# Patient Record
Sex: Female | Born: 1970 | Race: Black or African American | Hispanic: No | Marital: Single | Smoking: Current every day smoker
Health system: Southern US, Community
[De-identification: ages and names within clinical notes are randomized; demographics above are authoritative.]

## PROBLEM LIST (undated history)

## (undated) DIAGNOSIS — G5600 Carpal tunnel syndrome, unspecified upper limb: Secondary | ICD-10-CM

## (undated) DIAGNOSIS — F419 Anxiety disorder, unspecified: Secondary | ICD-10-CM

## (undated) HISTORY — PX: NECK SURGERY: SHX720

## (undated) HISTORY — PX: INCISION AND DRAINAGE BREAST ABSCESS: SUR672

---

## 2001-10-04 ENCOUNTER — Other Ambulatory Visit: Admission: RE | Admit: 2001-10-04 | Discharge: 2001-10-04 | Payer: Self-pay | Admitting: Obstetrics and Gynecology

## 2004-03-12 ENCOUNTER — Other Ambulatory Visit: Admission: RE | Admit: 2004-03-12 | Discharge: 2004-03-12 | Payer: Self-pay | Admitting: Obstetrics and Gynecology

## 2005-06-02 ENCOUNTER — Other Ambulatory Visit: Admission: RE | Admit: 2005-06-02 | Discharge: 2005-06-02 | Payer: Self-pay | Admitting: Obstetrics and Gynecology

## 2011-07-09 ENCOUNTER — Other Ambulatory Visit: Payer: Self-pay | Admitting: Orthopedic Surgery

## 2011-07-09 DIAGNOSIS — M5410 Radiculopathy, site unspecified: Secondary | ICD-10-CM

## 2011-07-11 ENCOUNTER — Ambulatory Visit
Admission: RE | Admit: 2011-07-11 | Discharge: 2011-07-11 | Disposition: A | Discharge status: Home / Self Care | Payer: PRIVATE HEALTH INSURANCE | Source: Ambulatory Visit | Attending: Orthopedic Surgery | Admitting: Orthopedic Surgery

## 2011-07-11 DIAGNOSIS — M5410 Radiculopathy, site unspecified: Secondary | ICD-10-CM

## 2011-12-08 ENCOUNTER — Other Ambulatory Visit: Payer: Self-pay | Admitting: Physician Assistant

## 2011-12-08 ENCOUNTER — Other Ambulatory Visit (HOSPITAL_COMMUNITY)
Admission: RE | Admit: 2011-12-08 | Discharge: 2011-12-08 | Disposition: A | Discharge status: Home / Self Care | Payer: BC Managed Care – PPO | Source: Ambulatory Visit | Attending: Family Medicine | Admitting: Family Medicine

## 2011-12-08 DIAGNOSIS — Z124 Encounter for screening for malignant neoplasm of cervix: Secondary | ICD-10-CM | POA: Insufficient documentation

## 2012-06-25 ENCOUNTER — Encounter (HOSPITAL_COMMUNITY): Payer: Self-pay | Admitting: Emergency Medicine

## 2012-06-25 ENCOUNTER — Emergency Department (INDEPENDENT_AMBULATORY_CARE_PROVIDER_SITE_OTHER)
Admission: EM | Admit: 2012-06-25 | Discharge: 2012-06-25 | Disposition: A | Discharge status: Home / Self Care | Payer: PRIVATE HEALTH INSURANCE | Source: Home / Self Care | Attending: Emergency Medicine | Admitting: Emergency Medicine

## 2012-06-25 DIAGNOSIS — R21 Rash and other nonspecific skin eruption: Secondary | ICD-10-CM

## 2012-06-25 DIAGNOSIS — M2550 Pain in unspecified joint: Secondary | ICD-10-CM

## 2012-06-25 DIAGNOSIS — L304 Erythema intertrigo: Secondary | ICD-10-CM

## 2012-06-25 DIAGNOSIS — L538 Other specified erythematous conditions: Secondary | ICD-10-CM

## 2012-06-25 MED ORDER — KETOCONAZOLE 2 % EX CREA: TOPICAL_CREAM | Freq: Every day | CUTANEOUS | Status: DC

## 2012-06-25 NOTE — ED Notes (Signed)
Pt c/o rash on face that started out above right eye x 41month now rash has  Spread over face within the past two weeks and is having severe itching.  Pt has used only soap and water for cleansing.   Pt denies any changes in diet and or products.  Pt also has pain in right foot that has been present for 1 wk. Pt states that there is a bump btwn 2 and 3rd toe and the pain is a burning sensation,.  Most pain is felt with walking. Pt has used soaks and sprays with mild relief.

## 2012-06-25 NOTE — ED Provider Notes (Signed)
History     CSN: [id number]  Arrival date & time 06/25/12  7829   First MD Initiated Contact with Patient 06/25/12 1844      Chief Complaint  Patient presents with  . Rash    rash above right eye x 1 month pt noticed rash started to spread two weeks ago. severe itching  . Foot Pain    bump in between second and third toe on the right foot. with burining sensation    (Consider location/radiation/quality/duration/timing/severity/associated sxs/prior treatment) HPI Comments: Patient presents urgent care tonight complaining of 2 different issues. #1 she's been developing this rash on her face that started on her right upper eyebrow but had extended to both of her maxillary regions some more spread out on her cheeks. She said that it is itchy and she does not have any other rashes over in her neck arms or torso. She denies having used any new products. Has only used soap and water for cleansing. She also describes she's been having elbow and wrist pains for several weeks, sometimes even at rest. Patient describes also that she has had a area of tenderness and discharge for about a week in between the second and third toe of her right foot as burn at times. She has used some over-the-counter fungal cream that has not worked  Patient is a 41 y.o. female presenting with rash and lower extremity pain. The history is provided by the patient.  Rash  This is a new problem. The current episode started more than 1 week ago. The problem has not changed since onset.The problem is associated with nothing. There has been no fever. The patient is experiencing no pain. Associated symptoms include weeping. Pertinent negatives include no blisters, no itching and no pain.  Foot Pain    History reviewed. No pertinent past medical history.  Past Surgical History  Procedure Date  . Neck surgery     History reviewed. No pertinent family history.  History  Substance Use Topics  . Smoking status: Current  Every Day Smoker -- 1.0 packs/day    Types: Cigarettes  . Smokeless tobacco: Not on file  . Alcohol Use: Yes     occasional    OB History    Grav Para Term Preterm Abortions TAB SAB Ect Mult Living                  Review of Systems  Constitutional: Negative for fever, chills, activity change and appetite change.  Respiratory: Cough: yelling.   Musculoskeletal: Positive for arthralgias. Negative for back pain.  Skin: Positive for rash. Negative for itching.    Allergies  Review of patient's allergies indicates no known allergies.  Home Medications   Current Outpatient Rx  Name Route Sig Dispense Refill  . KETOCONAZOLE 2 % EX CREA Topical Apply topically daily. Apply bid affected toes x 3 weeks 15 g 0    BP 144/88  Pulse 78  Temp 98.7 F (37.1 C)  Resp 16  SpO2 98%  LMP 06/04/2012  Physical Exam  Nursing note and vitals reviewed. Constitutional: Vital signs are normal. She appears well-developed and well-nourished.  Non-toxic appearance. She does not have a sickly appearance. She does not appear ill. No distress.  HENT:  Head:    Cardiovascular: Normal rate.   Skin: Rash noted. There is erythema.    ED Course  Procedures (including critical care time)  Labs Reviewed - No data to display No results found.   1. Facial rash  2. Arthralgia   3. Toe web intertrigo       MDM  Patient presented with a facial malar rash- she was referred to see a dermatologist as this could be secondary to connective tissue disorder. She does report some arthralgias. Patient also had a into her digital fungal infection. He was treated with Nizoral.    Jimmie Molly, MD 06/25/12 2027

## 2012-12-02 ENCOUNTER — Emergency Department (HOSPITAL_COMMUNITY)
Admission: EM | Admit: 2012-12-02 | Discharge: 2012-12-02 | Disposition: A | Discharge status: Home / Self Care | Payer: BC Managed Care – PPO | Source: Home / Self Care

## 2012-12-02 ENCOUNTER — Encounter (HOSPITAL_COMMUNITY): Payer: Self-pay | Admitting: *Deleted

## 2012-12-02 DIAGNOSIS — N61 Mastitis without abscess: Secondary | ICD-10-CM

## 2012-12-02 MED ORDER — CEFTRIAXONE SODIUM 1 G IJ SOLR
1.0000 g | Freq: Once | INTRAMUSCULAR | Status: AC
  Administered 2012-12-02: 1 g via INTRAMUSCULAR

## 2012-12-02 MED ORDER — FLUCONAZOLE 150 MG PO TABS: ORAL_TABLET | ORAL | Status: DC

## 2012-12-02 MED ORDER — CEPHALEXIN 500 MG PO CAPS: 500.0000 mg | ORAL_CAPSULE | Freq: Four times a day (QID) | ORAL | Status: DC

## 2012-12-02 MED ORDER — CEFTRIAXONE SODIUM 1 G IJ SOLR
INTRAMUSCULAR | Status: AC
  Filled 2012-12-02: qty 10

## 2012-12-02 MED ORDER — HYDROCODONE-ACETAMINOPHEN 5-325 MG PO TABS: 1.0000 | ORAL_TABLET | ORAL | Status: DC | PRN

## 2012-12-02 MED ORDER — LIDOCAINE HCL (PF) 1 % IJ SOLN
INTRAMUSCULAR | Status: AC
  Filled 2012-12-02: qty 5

## 2012-12-02 NOTE — ED Notes (Signed)
Bump on R breast onset Mon.  Noted redness around her nipple this AM.

## 2012-12-02 NOTE — ED Provider Notes (Signed)
Medical screening examination/treatment/procedure(s) were performed by resident physician or non-physician practitioner and as supervising physician I was immediately available for consultation/collaboration.   Ayven Pheasant DOUGLAS MD.   Bryam Taborda D Cathlyn Tersigni, MD 12/02/12 2000 

## 2012-12-02 NOTE — ED Provider Notes (Signed)
History     CSN: [id number]  Arrival date & time 12/02/12  1547   First MD Initiated Contact with Patient 12/02/12 1734      No chief complaint on file.   (Consider location/radiation/quality/duration/timing/severity/associated sxs/prior treatment) HPI Comments: 42 year old female developed itching and mild swelling in the right nipple approximately 3 days ago. The erythema and discomfort has progressed in this time today she is complaining of increased pain. Denies fever, chills or other symptoms. The left breast is unaffected. Marcella, MA present during the exam.   No past medical history on file.  Past Surgical History  Procedure Laterality Date  . Neck surgery      No family history on file.  History  Substance Use Topics  . Smoking status: Current Every Day Smoker -- 1.00 packs/day    Types: Cigarettes  . Smokeless tobacco: Not on file  . Alcohol Use: Yes     Comment: occasional    OB History   Grav Para Term Preterm Abortions TAB SAB Ect Mult Living                  Review of Systems  Constitutional: Negative.   Respiratory: Negative.   Gastrointestinal: Negative.   Skin:       Per history of present illness  Hematological: Negative.     Allergies  Review of patient's allergies indicates no known allergies.  Home Medications   Current Outpatient Rx  Name  Route  Sig  Dispense  Refill  . cephALEXin (KEFLEX) 500 MG capsule   Oral   Take 1 capsule (500 mg total) by mouth 4 (four) times daily. X 10 days   40 capsule   0   . fluconazole (DIFLUCAN) 150 MG tablet      1 tab po x 1. May repeat in 72 hours if no improvement   2 tablet   0   . HYDROcodone-acetaminophen (NORCO/VICODIN) 5-325 MG per tablet   Oral   Take 1 tablet by mouth every 4 (four) hours as needed for pain.   15 tablet   0   . ketoconazole (NIZORAL) 2 % cream   Topical   Apply topically daily. Apply bid affected toes x 3 weeks   15 g   0     BP 144/73  Pulse 84   Temp(Src) 98 F (36.7 C) (Oral)  Resp 16  SpO2 100%  Physical Exam  Nursing note and vitals reviewed. Constitutional: She is oriented to person, place, and time. She appears well-developed and well-nourished. No distress.  Eyes: EOM are normal.  Neck: Normal range of motion. Neck supple.  Pulmonary/Chest: Effort normal.  Musculoskeletal: She exhibits no edema.  Lymphadenopathy:    She has no cervical adenopathy.    She has no axillary adenopathy.       Right: No supraclavicular adenopathy present.       Left: No supraclavicular adenopathy present.  Neurological: She is alert and oriented to person, place, and time.  Skin:  Right breast with minor swelling of the nipple and very old. Approximately 4 cm diameter area of induration beneath the area all and erythema extends  circumferentially covering approximately 30-40% of the anterior breast.    ED Course  Procedures (including critical care time)  Labs Reviewed - No data to display No results found.   1. Mastitis, right, acute   2. Cellulitis of female breast       MDM  Rocephin 1 gm IM now. Keflex 500 qid  x 10 days Norco 5 q 4 h prn pain Warm compresses several times a day. Return here in 2 days for a recheck.        Hayden Rasmussen, NP 12/02/12 [phone]

## 2012-12-05 ENCOUNTER — Encounter (INDEPENDENT_AMBULATORY_CARE_PROVIDER_SITE_OTHER): Payer: Self-pay | Admitting: Surgery

## 2012-12-05 ENCOUNTER — Encounter (INDEPENDENT_AMBULATORY_CARE_PROVIDER_SITE_OTHER): Payer: Self-pay

## 2012-12-05 ENCOUNTER — Ambulatory Visit (INDEPENDENT_AMBULATORY_CARE_PROVIDER_SITE_OTHER): Payer: BC Managed Care – PPO | Admitting: Surgery

## 2012-12-05 VITALS — BP 124/82 | HR 63 | Temp 98.0°F | Resp 18 | Ht >= 80 in | Wt 151.0 lb

## 2012-12-05 DIAGNOSIS — N61 Mastitis without abscess: Secondary | ICD-10-CM

## 2012-12-05 DIAGNOSIS — N611 Abscess of the breast and nipple: Secondary | ICD-10-CM | POA: Insufficient documentation

## 2012-12-05 DIAGNOSIS — Z720 Tobacco use: Secondary | ICD-10-CM | POA: Insufficient documentation

## 2012-12-05 DIAGNOSIS — F17200 Nicotine dependence, unspecified, uncomplicated: Secondary | ICD-10-CM

## 2012-12-05 MED ORDER — IBUPROFEN 800 MG PO TABS: 800.0000 mg | ORAL_TABLET | Freq: Four times a day (QID) | ORAL | Status: DC | PRN

## 2012-12-05 MED ORDER — HYDROCODONE-ACETAMINOPHEN 5-325 MG PO TABS: 1.0000 | ORAL_TABLET | Freq: Four times a day (QID) | ORAL | Status: DC | PRN

## 2012-12-05 NOTE — Addendum Note (Signed)
Addended by: Ethlyn Gallery on: 12/05/2012 02:23 PM   Modules accepted: Orders

## 2012-12-05 NOTE — Patient Instructions (Addendum)
Abscess An abscess is an infected area that contains a collection of pus and debris.It can occur in almost any part of the body. An abscess is also known as a furuncle or boil. CAUSES  An abscess occurs when tissue gets infected. This can occur from blockage of oil or sweat glands, infection of hair follicles, or a minor injury to the skin. As the body tries to fight the infection, pus collects in the area and creates pressure under the skin. This pressure causes pain. People with weakened immune systems have difficulty fighting infections and get certain abscesses more often.  SYMPTOMS Usually an abscess develops on the skin and becomes a painful mass that is red, warm, and tender. If the abscess forms under the skin, you may feel a moveable soft area under the skin. Some abscesses break open (rupture) on their own, but most will continue to get worse without care. The infection can spread deeper into the body and eventually into the bloodstream, causing you to feel ill.  DIAGNOSIS  Your caregiver will take your medical history and perform a physical exam. A sample of fluid may also be taken from the abscess to determine what is causing your infection. TREATMENT  Your caregiver may prescribe antibiotic medicines to fight the infection. However, taking antibiotics alone usually does not cure an abscess. Your caregiver may need to make a small cut (incision) in the abscess to drain the pus. In some cases, gauze is packed into the abscess to reduce pain and to continue draining the area. HOME CARE INSTRUCTIONS   Only take over-the-counter or prescription medicines for pain, discomfort, or fever as directed by your caregiver.  If you were prescribed antibiotics, take them as directed. Finish them even if you start to feel better.  If gauze is used, follow your caregiver's directions for changing the gauze.  To avoid spreading the infection:  Keep your draining abscess covered with a  bandage.  Wash your hands well.  Do not share personal care items, towels, or whirlpools with others.  Avoid skin contact with others.  Keep your skin and clothes clean around the abscess.  Keep all follow-up appointments as directed by your caregiver. SEEK MEDICAL CARE IF:   You have increased pain, swelling, redness, fluid drainage, or bleeding.  You have muscle aches, chills, or a general ill feeling.  You have a fever. MAKE SURE YOU:   Understand these instructions.  Will watch your condition.  Will get help right away if you are not doing well or get worse. Document Released: 05/27/2005 Document Revised: 02/16/2012 Document Reviewed: 10/30/2011 [employer] Patient Information 2013 [place], [place].   WOUND CARE  It is important that the wound be kept open.   -Keeping the skin edges apart will allow the wound to gradually heal from the base upwards.   - If the skin edges of the wound close too early, a new fluid pocket can form and infection can occur. -This is the reason to pack deeper wounds with gauze or ribbon -This is why drained wounds cannot be sewed closed right away  A healthy wound should form a lining of bright red "beefy" granulating tissue that will help shrink the wound and help the edges grow new skin into it.   -A little mucus / yellow discharge is normal (the body's natural way to try and form a scab) and should be gently washed off with soap and water with daily dressing changes.  -Green or foul smelling drainage implies bacterial  colonization and can slow wound healing - a short course of antibiotic ointment (3-5 days) can help it clear up.  Call the doctor if it does not improve or worsens  -Avoid use of antibiotic ointments for more than a week as they can slow wound healing over time.    -Sometimes other wound care products will be used to reduce need for dressing changes and/or help clean up dirty wounds -Sometimes the surgeon needs to debride the  wound in the office to remove dead or infected tissue out of the wound so it can heal more quickly and safely.    Change the dressing at least once a day -Wash the wound with mild soap and water gently every day.  It is good to shower or bathe the wound to help it clean out. -Use clean 4x4 gauze for medium/large wounds or ribbon plain NU-gauze for smaller wounds (it does not need to be sterile, just clean) -Keep the raw wound moist with a little saline or KY (saline) gel on the gauze.  -A dry wound will take longer to heal.  -Keep the skin dry around the wound to prevent breakdown and irritation. -Pack the wound down to the base -The goal is to keep the skin apart, not overpack the wound -Use a Q-tip or blunt-tipped kabob stick toothpick to push the gauze down to the base in narrow or deep wounds   -Cover with a clean gauze and tape -paper or Medipore tape tend to be gentle on the skin -rotate the orientation of the tape to avoid repeated stress/trauma on the skin -using an ACE or Coban wrap on wounds on arms or legs can be used instead.  Complete all antibiotics through the entire prescription to help the infection heal and prevent new places of infection   Returning the see the surgeon is helpful to follow the healing process and help the wound close as fast as possible.    Mastitis  Mastitis is a bacterial infection of the breast tissue. CAUSES  Bacteria causes infection by entering the breast tissue through cuts or openings in the skin. Typically, this occurs with breastfeeding due to cracked or irritated skin. It can be associated with plugged ducts. Nipple piercing can also lead to mastitis. SYMPTOMS  In mastitis, an area of the breast becomes swollen, red, tender, and painful. You may notice you have a fever and swelling of the glands under your arm on that side. If the infection is allowed to progress, a collection of pus (abscess) may develop. DIAGNOSIS  Your caregiver can  diagnose mastitis based on your symptoms and upon examination. The diagnosis can be confirmed if pus can be expressed from the breast. This pus can be examined in the lab to determine which bacteria are present. If an abscess has developed, the fluid in the abscess can be removed with a needle. This is used to confirm the diagnosis and determine the bacteria present. In most cases, pus will not be present. Blood tests can be done to determine if your body is fighting a bacterial infection. Sometimes, a mammogram or ultrasound will be recommended to exclude other breast diseases including cancer. Other rare forms of mastitis:  Tuberculosis mastitis is rare. The TB germ can affect the breast if it is present in some other part of the body. The breast may be slightly tender with a mass, but not tender or painful.  Syphilis of the nipple usually has an ulcer that is not tender.  Actinomycosis  is a very rare bacterial infection of the breast that presents as a mass in the breast that is not tender or painful.  Phlebitis (inflammation of blood vessels) of the breast is an inflammation of the veins in the breast. It may be caused by tight fitting bras, surgery, or trauma to the breast.  Inflammatory carcinoma of the breast looks like mastitis because the breasts are red, swollen, or tender, but it is a rare form of breast cancer. TREATMENT  Antibiotic medication is used to treat the bacterial infection. Your caregiver will determine which bacteria are most likely to be causing the infection and select an antibiotic. This is sometimes changed based on the results of cultures, or if there is no response to the antibiotic selected. Antibiotics are usually given by mouth. If you are breastfeeding, it is important to continue to empty the breast. Your caregiver can tell you whether or not this milk is safe for your infant, or needs to be thrown away. Pain can usually be treated with medication. HOME CARE  INSTRUCTIONS   Take your antibiotics as directed. Finish them even if you start to feel better.  Only take over-the-counter or prescription medication for pain, discomfort, or fever as directed by your caregiver.  If breastfeeding, keep your nipples clean and dry. Your caregiver may tell you to stop nursing until he or she feels it is safe for your baby. Use a breast pump as instructed if forced to stop nursing.  Do not wear a tight bra. Wear a good support bra.  Empty the first breast completely before going to the other breast. If your baby is not emptying your breasts completely for some reason, use a breast pump to empty your breasts.  If you go back to work, pump your breasts while at work to stay in time with your nursing schedule.  Increase your fluid intake especially if you have a fever.  Avoid having your breasts get overly filled with milk (engorged). SEEK MEDICAL CARE IF:   You develop pus-like (purulent) discharge from the breast.  Your symptoms get worse.  You do not seem to be responding to your treatment within 2 days. SEEK IMMEDIATE MEDICAL CARE IF:   You have a fever.  Your pain and swelling is getting worse.  You develop pain that is not controlled with medicine.  You develop a red line extending from the breast toward your armpit. Document Released: 08/17/2005 Document Revised: 11/09/2011 Document Reviewed: 04/06/2008 [facility] Patient Information 2013 [place], [place]. Managing Pain  Pain after surgery or related to activity is often due to strain/injury to muscle, tendon, nerves and/or incisions.  This pain is usually short-term and will improve in a few months.   Many people find it helpful to do the following things TOGETHER to help speed the process of healing and to get back to regular activity more quickly:  1. Avoid heavy physical activity a.  no lifting greater than 20 pounds b. Do not "push through" the pain.  Listen to your body and avoid  positions and maneuvers than reproduce the pain c. Walking is okay as tolerated, but go slowly and stop when getting sore.  d. Remember: If it hurts to do it, then don't do it! 2. Take Anti-inflammatory medication  a. Take with food/snack around the clock for 1-2 weeks i. This helps the muscle and nerve tissues become less irritable and calm down faster b. Choose ONE of the following over-the-counter medications: i. Naproxen 220mg  tabs (ex. Aleve)  1-2 pills twice a day  ii. Ibuprofen 200mg  tabs (ex. Advil, Motrin) 3-4 pills with every meal and just before bedtime iii. Acetaminophen 500mg  tabs (Tylenol) 1-2 pills with every meal and just before bedtime 3. Use a Heating pad or Ice/Cold Pack a. 4-6 times a day b. May use warm bath/hottub  or showers 4. Try Gentle Massage and/or Stretching  a. at the area of pain many times a day b. stop if you feel pain - do not overdo it  Try these steps together to help you body heal faster and avoid making things get worse.  Doing just one of these things may not be enough.    If you are not getting better after two weeks or are noticing you are getting worse, contact our office for further advice; we may need to re-evaluate you & see what other things we can do to help.    Smoking Cessation Quitting smoking is important to your health and has many advantages. However, it is not always easy to quit since nicotine is a very addictive drug. Often times, people try 3 times or more before being able to quit. This document explains the best ways for you to prepare to quit smoking. Quitting takes hard work and a lot of effort, but you can do it. ADVANTAGES OF QUITTING SMOKING  You will live longer, feel better, and live better.  Your body will feel the impact of quitting smoking almost immediately.  Within 20 minutes, blood pressure decreases. Your pulse returns to its normal level.  After 8 hours, carbon monoxide levels in the blood return to normal. Your  oxygen level increases.  After 24 hours, the chance of having a heart attack starts to decrease. Your breath, hair, and body stop smelling like smoke.  After 48 hours, damaged nerve endings begin to recover. Your sense of taste and smell improve.  After 72 hours, the body is virtually free of nicotine. Your bronchial tubes relax and breathing becomes easier.  After 2 to 12 weeks, lungs can hold more air. Exercise becomes easier and circulation improves.  The risk of having a heart attack, stroke, cancer, or lung disease is greatly reduced.  After 1 year, the risk of coronary heart disease is cut in half.  After 5 years, the risk of stroke falls to the same as a nonsmoker.  After 10 years, the risk of lung cancer is cut in half and the risk of other cancers decreases significantly.  After 15 years, the risk of coronary heart disease drops, usually to the level of a nonsmoker.  If you are pregnant, quitting smoking will improve your chances of having a healthy baby.  The people you live with, especially any children, will be healthier.  You will have extra money to spend on things other than cigarettes. QUESTIONS TO THINK ABOUT BEFORE ATTEMPTING TO QUIT You may want to talk about your answers with your caregiver.  Why do you want to quit?  If you tried to quit in the past, what helped and what did not?  What will be the most difficult situations for you after you quit? How will you plan to handle them?  Who can help you through the tough times? Your family? Friends? A caregiver?  What pleasures do you get from smoking? What ways can you still get pleasure if you quit? Here are some questions to ask your caregiver:  How can you help me to be successful at quitting?  What medicine do  you think would be best for me and how should I take it?  What should I do if I need more help?  What is smoking withdrawal like? How can I get information on withdrawal? GET READY  Set a quit  date.  Change your environment by getting rid of all cigarettes, ashtrays, matches, and lighters in your home, car, or work. Do not let people smoke in your home.  Review your past attempts to quit. Think about what worked and what did not. GET SUPPORT AND ENCOURAGEMENT You have a better chance of being successful if you have help. You can get support in many ways.  Tell your family, friends, and co-workers that you are going to quit and need their support. Ask them not to smoke around you.  Get individual, group, or telephone counseling and support. Programs are available at [employer] and health centers. Call your local health department for information about programs in your area.  Spiritual beliefs and practices may help some smokers quit.  Download a "quit meter" on your computer to keep track of quit statistics, such as how long you have gone without smoking, cigarettes not smoked, and money saved.  Get a self-help book about quitting smoking and staying off of tobacco. LEARN NEW SKILLS AND BEHAVIORS  Distract yourself from urges to smoke. Talk to someone, go for a walk, or occupy your time with a task.  Change your normal routine. Take a different route to work. Drink tea instead of coffee. Eat breakfast in a different place.  Reduce your stress. Take a hot bath, exercise, or read a book.  Plan something enjoyable to do every day. Reward yourself for not smoking.  Explore interactive web-based programs that specialize in helping you quit. GET MEDICINE AND USE IT CORRECTLY Medicines can help you stop smoking and decrease the urge to smoke. Combining medicine with the above behavioral methods and support can greatly increase your chances of successfully quitting smoking.  Nicotine replacement therapy helps deliver nicotine to your body without the negative effects and risks of smoking. Nicotine replacement therapy includes nicotine gum, lozenges, inhalers, nasal sprays, and  skin patches. Some may be available over-the-counter and others require a prescription.  Antidepressant medicine helps people abstain from smoking, but how this works is unknown. This medicine is available by prescription.  Nicotinic receptor partial agonist medicine simulates the effect of nicotine in your brain. This medicine is available by prescription. Ask your caregiver for advice about which medicines to use and how to use them based on your health history. Your caregiver will tell you what side effects to look out for if you choose to be on a medicine or therapy. Carefully read the information on the package. Do not use any other product containing nicotine while using a nicotine replacement product.  RELAPSE OR DIFFICULT SITUATIONS Most relapses occur within the first 3 months after quitting. Do not be discouraged if you start smoking again. Remember, most people try several times before finally quitting. You may have symptoms of withdrawal because your body is used to nicotine. You may crave cigarettes, be irritable, feel very hungry, cough often, get headaches, or have difficulty concentrating. The withdrawal symptoms are only temporary. They are strongest when you first quit, but they will go away within 10 14 days. To reduce the chances of relapse, try to:  Avoid drinking alcohol. Drinking lowers your chances of successfully quitting.  Reduce the amount of caffeine you consume. Once you quit smoking, the amount  of caffeine in your body increases and can give you symptoms, such as a rapid heartbeat, sweating, and anxiety.  Avoid smokers because they can make you want to smoke.  Do not let weight gain distract you. Many smokers will gain weight when they quit, usually less than 10 pounds. Eat a healthy diet and stay active. You can always lose the weight gained after you quit.  Find ways to improve your mood other than smoking. FOR MORE INFORMATION  www.smokefree.gov  Document  Released: 08/11/2001 Document Revised: 02/16/2012 Document Reviewed: 11/26/2011 [facility] Patient Information 2013 [place], [place].

## 2012-12-05 NOTE — Progress Notes (Signed)
Subjective:     Patient ID: Yesenia Miller, female   DOB: 11-11-1970, 42 y.o.   MRN: [id number]  HPI  Setareh Rom  Jan 07, 1971 [id number]  Patient Care Team: Milus Height, PA-C as PCP - General (Nurse Practitioner)  This patient is a 42 y.o.female who presents today for surgical evaluation at the request of Mrs. Sherlyn Lick.   Reason for visit: Right breast pain and swelling.  Probable abscess.  Young smoking female.  No history of skin or breast problems.  Never breast-fed.  No falls or trauma.  Noticed itching at her right nipple.  Scratched.  Got worsening swelling and pain.  Went to urgent care 3 days ago.  Started on oral antibiotics (Keflex w fluconazole).  Seemed to worsen a little bit.  Certainly not improved.  Tender.  Concerned.  Sent to me for evaluation.  Patient Active Problem List  Diagnosis  . Abscess of breast, right  . Tobacco abuse    No past medical history on file.  Past Surgical History  Procedure Laterality Date  . Neck surgery      History   Social History  . Marital Status: Single    Spouse Name: N/A    Number of Children: N/A  . Years of Education: N/A   Occupational History  . Not on file.   Social History Main Topics  . Smoking status: Current Every Day Smoker -- 1.00 packs/day    Types: Cigarettes  . Smokeless tobacco: Not on file  . Alcohol Use: Yes     Comment: occasional  . Drug Use: No  . Sexually Active: Yes    Birth Control/ Protection: None   Other Topics Concern  . Not on file   Social History Narrative  . No narrative on file    Family History  Problem Relation Age of Onset  . Diabetes Mother   . Thyroid disease Mother     Current Outpatient Prescriptions  Medication Sig Dispense Refill  . cephALEXin (KEFLEX) 500 MG capsule Take 1 capsule (500 mg total) by mouth 4 (four) times daily. X 10 days  40 capsule  0  . fluconazole (DIFLUCAN) 150 MG tablet 1 tab po x 1. May repeat in 72 hours if no improvement  2  tablet  0  . ibuprofen (ADVIL,MOTRIN) 800 MG tablet Take 800 mg by mouth every 8 (eight) hours as needed for pain.      Marland Kitchen HYDROcodone-acetaminophen (NORCO/VICODIN) 5-325 MG per tablet Take 1 tablet by mouth every 4 (four) hours as needed for pain.  15 tablet  0  . ketoconazole (NIZORAL) 2 % cream Apply topically daily. Apply bid affected toes x 3 weeks  15 g  0   No current facility-administered medications for this visit.     No Known Allergies  BP 124/82  Pulse 63  Temp(Src) 98 F (36.7 C)  Resp 18  Ht 7\' 8"  (2.337 m)  Wt 151 lb (68.493 kg)  BMI 12.54 kg/m2  LMP 11/28/2012  No results found.   Review of Systems  Constitutional: Negative for fever, chills, diaphoresis, appetite change and fatigue.  HENT: Negative for ear pain, sore throat, trouble swallowing, neck pain and ear discharge.   Eyes: Negative for photophobia, discharge and visual disturbance.  Respiratory: Negative for cough, choking, chest tightness and shortness of breath.   Cardiovascular: Negative for chest pain and palpitations.  Gastrointestinal: Negative for nausea, vomiting, abdominal pain, diarrhea, constipation, anal bleeding and rectal pain.  Genitourinary: Negative for dysuria, frequency and  difficulty urinating.  Musculoskeletal: Negative for myalgias and gait problem.  Skin: Negative for color change, pallor and rash.  Neurological: Negative for dizziness, speech difficulty, weakness and numbness.  Hematological: Negative for adenopathy.  Psychiatric/Behavioral: Negative for confusion and agitation. The patient is not nervous/anxious.        Objective:   Physical Exam  Constitutional: She is oriented to person, place, and time. She appears well-developed and well-nourished. No distress.  HENT:  Head: Normocephalic.  Mouth/Throat: Oropharynx is clear and moist. No oropharyngeal exudate.  Eyes: Conjunctivae and EOM are normal. Pupils are equal, round, and reactive to light. No scleral icterus.    Neck: Normal range of motion. Neck supple. No tracheal deviation present.  Cardiovascular: Normal rate, regular rhythm and intact distal pulses.   Pulmonary/Chest: Effort normal and breath sounds normal. No respiratory distress. She exhibits no mass and no tenderness. Right breast exhibits inverted nipple, mass, skin change and tenderness. Right breast exhibits no nipple discharge. Left breast exhibits no inverted nipple, no mass, no nipple discharge, no skin change and no tenderness. Breasts are asymmetrical.    Abdominal: Soft. She exhibits no distension and no mass. There is no tenderness. Hernia confirmed negative in the right inguinal area and confirmed negative in the left inguinal area.  Genitourinary: No vaginal discharge found.  Musculoskeletal: Normal range of motion. She exhibits no tenderness.  Lymphadenopathy:       Head (right side): No submental and no submandibular adenopathy present.       Head (left side): No submental and no submandibular adenopathy present.    She has no cervical adenopathy.    She has no axillary adenopathy.       Right: No inguinal adenopathy present.       Left: No inguinal adenopathy present.  Neurological: She is alert and oriented to person, place, and time. No cranial nerve deficit. She exhibits normal muscle tone. Coordination normal.  Skin: Skin is warm and dry. No rash noted. She is not diaphoretic. No erythema.  Psychiatric: She has a normal mood and affect. Her behavior is normal. Judgment and thought content normal.       Assessment:     Right breast abscess and upper outer nipple with cellulitis.     Plan:     Incision and drainage:  The pathophysiology of subcutaneous abscess and differential diagnosis was discussed.  Natural history progression was discussed.  The patient's symptoms are not adequately controlled.  Non-operative treatment has not healed the abscess.  Therefore, I recommended incision & drainage of the abscess to allow  the infection to resolve and heal.  Technique, risks, benefits, alternatives discussed.  The patient expressed understanding & wished to proceed.  I placed a field block with local anaesthetic.  I incised the skin over the abscess to release the infection.   I packed the wound with 1/4" ribbon NU-Gauze. 2x2cm superficial abscess w 4cm deeper tracking  The patient tolerated the procedure.  Continue Keflex for now.  Switched based on what cultures show.  We will have the patient return to clinic for close follow up to make sure the infection heals.  She is out of pain medication.  I renewed her Norco and ibuprofen  We talked to the patient about the dangers of smoking.  We stressed that tobacco use dramatically increases the risk of peri-operative complications such as infection, tissue necrosis leaving to problems with incision/wound and organ healing, heart attack, stroke, DVT, pulmonary embolism, and death.  We noted  there are programs in our community to help stop smoking.

## 2012-12-07 ENCOUNTER — Ambulatory Visit (INDEPENDENT_AMBULATORY_CARE_PROVIDER_SITE_OTHER): Payer: BC Managed Care – PPO | Admitting: Surgery

## 2012-12-07 ENCOUNTER — Encounter (INDEPENDENT_AMBULATORY_CARE_PROVIDER_SITE_OTHER): Payer: Self-pay | Admitting: Surgery

## 2012-12-07 VITALS — BP 132/88 | HR 72 | Temp 97.2°F | Resp 16 | Ht 64.75 in | Wt 153.0 lb

## 2012-12-07 DIAGNOSIS — N611 Abscess of the breast and nipple: Secondary | ICD-10-CM

## 2012-12-07 DIAGNOSIS — Z720 Tobacco use: Secondary | ICD-10-CM

## 2012-12-07 DIAGNOSIS — N61 Mastitis without abscess: Secondary | ICD-10-CM

## 2012-12-07 DIAGNOSIS — F17200 Nicotine dependence, unspecified, uncomplicated: Secondary | ICD-10-CM

## 2012-12-07 NOTE — Patient Instructions (Addendum)
Seen wound care below.  Pull out with in the wound on Saturday.  WOUND CARE  It is important that the wound be kept open.   -Keeping the skin edges apart will allow the wound to gradually heal from the base upwards.   - If the skin edges of the wound close too early, a new fluid pocket can form and infection can occur. -This is the reason to pack deeper wounds with gauze or ribbon -This is why drained wounds cannot be sewed closed right away  A healthy wound should form a lining of bright red "beefy" granulating tissue that will help shrink the wound and help the edges grow new skin into it.   -A little mucus / yellow discharge is normal (the body's natural way to try and form a scab) and should be gently washed off with soap and water with daily dressing changes.  -Green or foul smelling drainage implies bacterial colonization and can slow wound healing - a short course of antibiotic ointment (3-5 days) can help it clear up.  Call the doctor if it does not improve or worsens  -Avoid use of antibiotic ointments for more than a week as they can slow wound healing over time.    -Sometimes other wound care products will be used to reduce need for dressing changes and/or help clean up dirty wounds -Sometimes the surgeon needs to debride the wound in the office to remove dead or infected tissue out of the wound so it can heal more quickly and safely.    Change the dressing at least once a day -Wash the wound with mild soap and water gently every day.  It is good to shower or bathe the wound to help it clean out. -Use clean ribbon plain NU-gauze for smaller wounds (it does not need to be sterile, just clean) -Keep the raw wound moist with a little saline or KY (saline) gel on the gauze.  -A dry wound will take longer to heal.  -Keep the skin dry around the wound to prevent breakdown and irritation. -Pack the wound down to the base -The goal is to keep the skin apart, not overpack the wound -Use a  Q-tip or blunt-tipped kabob stick toothpick to push the gauze down to the base in narrow or deep wounds   -Cover with a clean gauze and tape -paper or Medipore tape tend to be gentle on the skin -rotate the orientation of the tape to avoid repeated stress/trauma on the skin -using an ACE or Coban wrap on wounds on arms or legs can be used instead.  Complete all antibiotics through the entire prescription to help the infection heal and prevent new places of infection   Returning the see the surgeon is helpful to follow the healing process and help the wound close as fast as possible.  We strongly recommend that you stop smoking.  Smoking increases the risk of surgery including infection in the form of an open wound, pus formation, abscess, hernia at an incision on the abdomen, etc.  You have an increased risk of other MAJOR complications such as stroke, heart attack, forming clots in the leg and/or lungs, and death.    While it can be one of the most difficult things to do, the [place] community has programs to help you stop.  Consider talking with your primary care physician about options.  Also, Smoking Cessation classes are available through the [facility]:  The smoking cessation program is a proven-effective program  from the American Lung Association. The program is available for anyone 69 and older who currently smokes. The program lasts for 7 weeks and is 8 sessions. Each class will be approximately 1 1/2 hours. The program is every Tuesday.  All classes are 12-1:30pm and same location.  Event Location Information:  Location: [facility] 2nd Floor Conference Room 2-037; located next to [facility] cross streets: [facility] Entrance into the [facility] is adjacent to the [facility] main entrance. The conference room is located on the 2nd floor.  Parking Instructions: Visitor parking is adjacent to  [employer] main entrance and the [employer] [phone] or check the Classes and Support Groups   [email or web address] the event of inclemet weather please call [phone] or view online at [email or web address]  Smoking Cessation, Tips for Success YOU CAN QUIT SMOKING If you are ready to quit smoking, congratulations! You have chosen to help yourself be healthier. Cigarettes bring nicotine, tar, carbon monoxide, and other irritants into your body. Your lungs, heart, and blood vessels will be able to work better without these poisons. There are many different ways to quit smoking. Nicotine gum, nicotine patches, a nicotine inhaler, or nicotine nasal spray can help with physical craving. Hypnosis, support groups, and medicines help break the habit of smoking. Here are some tips to help you quit for good.  Throw away all cigarettes.  Clean and remove all ashtrays from your home, work, and car.  On a card, write down your reasons for quitting. Carry the card with you and read it when you get the urge to smoke.  Cleanse your body of nicotine. Drink enough water and fluids to keep your urine clear or pale yellow. Do this after quitting to flush the nicotine from your body.  Learn to predict your moods. Do not let a bad situation be your excuse to have a cigarette. Some situations in your life might tempt you into wanting a cigarette.  Never have "just one" cigarette. It leads to wanting another and another. Remind yourself of your decision to quit.  Change habits associated with smoking. If you smoked while driving or when feeling stressed, try other activities to replace smoking. Stand up when drinking your coffee. Brush your teeth after eating. Sit in a different chair when you read the paper. Avoid alcohol while trying to quit, and try to drink fewer caffeinated beverages. Alcohol and caffeine may urge you to smoke.  Avoid foods and drinks that can  trigger a desire to smoke, such as sugary or spicy foods and alcohol.  Ask people who smoke not to smoke around you.  Have something planned to do right after eating or having a cup of coffee. Take a walk or exercise to perk you up. This will help to keep you from overeating.  Try a relaxation exercise to calm you down and decrease your stress. Remember, you may be tense and nervous for the first 2 weeks after you quit, but this will pass.  Find new activities to keep your hands busy. Play with a pen, coin, or rubber band. Doodle or draw things on paper.  Brush your teeth right after eating. This will help cut down on the craving for the taste of tobacco after meals. You can try mouthwash, too.  Use oral substitutes, such as lemon drops, carrots, a cinnamon stick, or chewing gum, in place of cigarettes. Keep them  handy so they are available when you have the urge to smoke.  When you have the urge to smoke, try deep breathing.  Designate your home as a nonsmoking area.  If you are a heavy smoker, ask your caregiver about a prescription for nicotine chewing gum. It can ease your withdrawal from nicotine.  Reward yourself. Set aside the cigarette money you save and buy yourself something nice.  Look for support from others. Join a support group or smoking cessation program. Ask someone at home or at work to help you with your plan to quit smoking.  Always ask yourself, "Do I need this cigarette or is this just a reflex?" Tell yourself, "Today, I choose not to smoke," or "I do not want to smoke." You are reminding yourself of your decision to quit, even if you do smoke a cigarette. HOW WILL I FEEL WHEN I QUIT SMOKING?  The benefits of not smoking start within days of quitting.  You may have symptoms of withdrawal because your body is used to nicotine (the addictive substance in cigarettes). You may crave cigarettes, be irritable, feel very hungry, cough often, get headaches, or have  difficulty concentrating.  The withdrawal symptoms are only temporary. They are strongest when you first quit but will go away within 10 to 14 days.  When withdrawal symptoms occur, stay in control. Think about your reasons for quitting. Remind yourself that these are signs that your body is healing and getting used to being without cigarettes.  Remember that withdrawal symptoms are easier to treat than the major diseases that smoking can cause.  Even after the withdrawal is over, expect periodic urges to smoke. However, these cravings are generally short-lived and will go away whether you smoke or not. Do not smoke!  If you relapse and smoke again, do not lose hope. Most smokers quit 3 times before they are successful.  If you relapse, do not give up! Plan ahead and think about what you will do the next time you get the urge to smoke. LIFE AS A NONSMOKER: MAKE IT FOR A MONTH, MAKE IT FOR LIFE Day 1: Hang this page where you will see it every day. Day 2: Get rid of all ashtrays, matches, and lighters. Day 3: Drink water. Breathe deeply between sips. Day 4: Avoid places with smoke-filled air, such as bars, clubs, or the smoking section of restaurants. Day 5: Keep track of how much money you save by not smoking. Day 6: Avoid boredom. Keep a good book with you or go to the movies. Day 7: Reward yourself! One week without smoking! Day 8: Make a dental appointment to get your teeth cleaned. Day 9: Decide how you will turn down a cigarette before it is offered to you. Day 10: Review your reasons for quitting. Day 11: Distract yourself. Stay active to keep your mind off smoking and to relieve tension. Take a walk, exercise, read a book, do a crossword puzzle, or try a new hobby. Day 12: Exercise. Get off the bus before your stop or use stairs instead of escalators. Day 13: Call on friends for support and encouragement. Day 14: Reward yourself! Two weeks without smoking! Day 15: Practice deep  breathing exercises. Day 16: Bet a friend that you can stay a nonsmoker. Day 17: Ask to sit in nonsmoking sections of restaurants. Day 18: Hang up "No Smoking" signs. Day 19: Think of yourself as a nonsmoker. Day 20: Each morning, tell yourself you will not smoke. Day 21:  Reward yourself! Three weeks without smoking! Day 22: Think of smoking in negative ways. Remember how it stains your teeth, gives you bad breath, and leaves you short of breath. Day 23: Eat a nutritious breakfast. Day 24:Do not relive your days as a smoker. Day 25: Hold a pencil in your hand when talking on the telephone. Day 26: Tell all your friends you do not smoke. Day 27: Think about how much better food tastes. Day 28: Remember, one cigarette is one too many. Day 29: Take up a hobby that will keep your hands busy. Day 30: Congratulations! One month without smoking! Give yourself a big reward. Your caregiver can direct you to community resources or hospitals for support, which may include:  Group support.  Education.  Hypnosis.  Subliminal therapy. Document Released: 05/15/2004 Document Revised: 11/09/2011 Document Reviewed: 06/03/2009 [facility] Patient Information 2013 [place], [place].

## 2012-12-07 NOTE — Progress Notes (Signed)
Subjective:     Patient ID: Yesenia Miller, female   DOB: [birth year], 42 y.o.   MRN: [id number]  HPI   Shacarra Choe  October 12, 1970 [id number]  Patient Care Team: Milus Height, PA-C as PCP - General (Nurse Practitioner)  This patient is a 42 y.o.female who presents today for surgical evaluation at the request of Mrs. Sherlyn Lick.   Reason for visit: Followup status post incision and drainage of right breast abscess with moderate cellulitis 12/05/2012.  Young smoking female.  No history of skin or breast problems.  Never breast-fed.  No falls or trauma. Developed a breast abscess with cellulitis.  I drained it two days ago.  She comes in today feeling better.  Pain less.  Redness & drainage going down.  Shonna Chock remains in place.  Continues on Keflex.  Continues to smoke.  Mother with her.  Patient Active Problem List  Diagnosis  . Abscess of breast, right, s/p I&D 12/05/2012  . Tobacco abuse    History reviewed. No pertinent past medical history.  Past Surgical History  Procedure Laterality Date  . Neck surgery    . Incision and drainage breast abscess      History   Social History  . Marital Status: Single    Spouse Name: N/A    Number of Children: N/A  . Years of Education: N/A   Occupational History  . Not on file.   Social History Main Topics  . Smoking status: Current Every Day Smoker -- 1.00 packs/day    Types: Cigarettes  . Smokeless tobacco: Not on file  . Alcohol Use: Yes     Comment: occasional  . Drug Use: No  . Sexually Active: Yes    Birth Control/ Protection: None   Other Topics Concern  . Not on file   Social History Narrative  . No narrative on file    Family History  Problem Relation Age of Onset  . Diabetes Mother   . Thyroid disease Mother     Current Outpatient Prescriptions  Medication Sig Dispense Refill  . cephALEXin (KEFLEX) 500 MG capsule Take 1 capsule (500 mg total) by mouth 4 (four) times daily. X 10 days  40 capsule  0  .  fluconazole (DIFLUCAN) 150 MG tablet 1 tab po x 1. May repeat in 72 hours if no improvement  2 tablet  0  . HYDROcodone-acetaminophen (NORCO/VICODIN) 5-325 MG per tablet Take 1-2 tablets by mouth every 6 (six) hours as needed for pain.  40 tablet  0  . ibuprofen (ADVIL,MOTRIN) 800 MG tablet Take 1 tablet (800 mg total) by mouth every 6 (six) hours as needed for pain or fever.  40 tablet  1  . ketoconazole (NIZORAL) 2 % cream Apply topically daily. Apply bid affected toes x 3 weeks  15 g  0   No current facility-administered medications for this visit.     No Known Allergies  BP 132/88  Pulse 72  Temp(Src) 97.2 F (36.2 C) (Temporal)  Resp 16  Ht 5' 4.75" (1.645 m)  Wt 153 lb (69.4 kg)  BMI 25.65 kg/m2  LMP 11/28/2012  No results found.   Review of Systems  Constitutional: Negative for fever, chills, diaphoresis, appetite change and fatigue.  HENT: Negative for ear pain, sore throat, trouble swallowing, neck pain and ear discharge.   Eyes: Negative for photophobia, discharge and visual disturbance.  Respiratory: Negative for cough, choking, chest tightness and shortness of breath.   Cardiovascular: Negative for chest pain and palpitations.  Gastrointestinal: Negative for nausea, vomiting, abdominal pain, diarrhea, constipation, anal bleeding and rectal pain.  Genitourinary: Negative for dysuria, frequency and difficulty urinating.  Musculoskeletal: Negative for myalgias and gait problem.  Skin: Negative for color change, pallor and rash.  Neurological: Negative for dizziness, speech difficulty, weakness and numbness.  Hematological: Negative for adenopathy.  Psychiatric/Behavioral: Negative for confusion and agitation. The patient is not nervous/anxious.        Objective:   Physical Exam  Constitutional: She is oriented to person, place, and time. She appears well-developed and well-nourished. No distress.  HENT:  Head: Normocephalic.  Mouth/Throat: Oropharynx is clear and  moist. No oropharyngeal exudate.  Eyes: Conjunctivae and EOM are normal. Pupils are equal, round, and reactive to light. No scleral icterus.  Neck: Normal range of motion. Neck supple. No tracheal deviation present.  Cardiovascular: Normal rate, regular rhythm and intact distal pulses.   Pulmonary/Chest: Effort normal and breath sounds normal. No respiratory distress. She exhibits no mass and no tenderness. Right breast exhibits inverted nipple, mass, skin change and tenderness. Right breast exhibits no nipple discharge. Left breast exhibits no inverted nipple, no mass, no nipple discharge, no skin change and no tenderness. Breasts are asymmetrical.    Abdominal: Soft. She exhibits no distension and no mass. There is no tenderness. Hernia confirmed negative in the right inguinal area and confirmed negative in the left inguinal area.  Genitourinary: No vaginal discharge found.  Musculoskeletal: Normal range of motion. She exhibits no tenderness.  Lymphadenopathy:       Head (right side): No submental and no submandibular adenopathy present.       Head (left side): No submental and no submandibular adenopathy present.    She has no cervical adenopathy.    She has no axillary adenopathy.       Right: No inguinal adenopathy present.       Left: No inguinal adenopathy present.  Neurological: She is alert and oriented to person, place, and time. No cranial nerve deficit. She exhibits normal muscle tone. Coordination normal.  Skin: Skin is warm and dry. No rash noted. She is not diaphoretic. No erythema.  Psychiatric: She has a normal mood and affect. Her behavior is normal. Judgment and thought content normal.       Assessment:     Right breast abscess and upper outer nipple with cellulitis - Improved now two days status post incision and drainage and oral antibiotics.    Plan:   Remove wick in a few days.  Continue soap and water on the region.  Continue outside packing.  Return to clinic  in two weeks to make sure she continues to improve.   Stay out of work until next week.  We gave another copy of the note to her.  Continue Keflex for now.  Switched based on what cultures show.    We talked to the patient about the dangers of smoking.  We stressed that tobacco use dramatically increases the risk of peri-operative complications such as infection, tissue necrosis leaving to problems with incision/wound and organ healing, heart attack, stroke, DVT, pulmonary embolism, and death.  We noted there are programs in our community to help stop smoking.

## 2012-12-08 LAB — WOUND CULTURE

## 2012-12-27 ENCOUNTER — Encounter (INDEPENDENT_AMBULATORY_CARE_PROVIDER_SITE_OTHER): Payer: Self-pay | Admitting: Surgery

## 2012-12-27 ENCOUNTER — Ambulatory Visit (INDEPENDENT_AMBULATORY_CARE_PROVIDER_SITE_OTHER): Payer: BC Managed Care – PPO | Admitting: Surgery

## 2012-12-27 VITALS — BP 134/62 | HR 60 | Temp 97.0°F | Resp 12 | Ht 64.0 in | Wt 152.0 lb

## 2012-12-27 DIAGNOSIS — N61 Mastitis without abscess: Secondary | ICD-10-CM

## 2012-12-27 DIAGNOSIS — N611 Abscess of the breast and nipple: Secondary | ICD-10-CM

## 2012-12-27 NOTE — Patient Instructions (Addendum)
Mastitis  Mastitis is a bacterial infection of the breast tissue. CAUSES  Bacteria causes infection by entering the breast tissue through cuts or openings in the skin. Typically, this occurs with breastfeeding due to cracked or irritated skin. It can be associated with plugged ducts. Nipple piercing can also lead to mastitis. SYMPTOMS  In mastitis, an area of the breast becomes swollen, red, tender, and painful. You may notice you have a fever and swelling of the glands under your arm on that side. If the infection is allowed to progress, a collection of pus (abscess) may develop. DIAGNOSIS  Your caregiver can diagnose mastitis based on your symptoms and upon examination. The diagnosis can be confirmed if pus can be expressed from the breast. This pus can be examined in the lab to determine which bacteria are present. If an abscess has developed, the fluid in the abscess can be removed with a needle. This is used to confirm the diagnosis and determine the bacteria present. In most cases, pus will not be present. Blood tests can be done to determine if your body is fighting a bacterial infection. Sometimes, a mammogram or ultrasound will be recommended to exclude other breast diseases including cancer. Other rare forms of mastitis:  Tuberculosis mastitis is rare. The TB germ can affect the breast if it is present in some other part of the body. The breast may be slightly tender with a mass, but not tender or painful.  Syphilis of the nipple usually has an ulcer that is not tender.  Actinomycosis is a very rare bacterial infection of the breast that presents as a mass in the breast that is not tender or painful.  Phlebitis (inflammation of blood vessels) of the breast is an inflammation of the veins in the breast. It may be caused by tight fitting bras, surgery, or trauma to the breast.  Inflammatory carcinoma of the breast looks like mastitis because the breasts are red, swollen, or tender, but it  is a rare form of breast cancer. TREATMENT  Antibiotic medication is used to treat the bacterial infection. Your caregiver will determine which bacteria are most likely to be causing the infection and select an antibiotic. This is sometimes changed based on the results of cultures, or if there is no response to the antibiotic selected. Antibiotics are usually given by mouth. If you are breastfeeding, it is important to continue to empty the breast. Your caregiver can tell you whether or not this milk is safe for your infant, or needs to be thrown away. Pain can usually be treated with medication. HOME CARE INSTRUCTIONS   Take your antibiotics as directed. Finish them even if you start to feel better.  Only take over-the-counter or prescription medication for pain, discomfort, or fever as directed by your caregiver.  If breastfeeding, keep your nipples clean and dry. Your caregiver may tell you to stop nursing until he or she feels it is safe for your baby. Use a breast pump as instructed if forced to stop nursing.  Do not wear a tight bra. Wear a good support bra.  Empty the first breast completely before going to the other breast. If your baby is not emptying your breasts completely for some reason, use a breast pump to empty your breasts.  If you go back to work, pump your breasts while at work to stay in time with your nursing schedule.  Increase your fluid intake especially if you have a fever.  Avoid having your breasts get overly   filled with milk (engorged). SEEK MEDICAL CARE IF:   You develop pus-like (purulent) discharge from the breast.  Your symptoms get worse.  You do not seem to be responding to your treatment within 2 days. SEEK IMMEDIATE MEDICAL CARE IF:   You have a fever.  Your pain and swelling is getting worse.  You develop pain that is not controlled with medicine.  You develop a red line extending from the breast toward your armpit. Document Released:  08/17/2005 Document Revised: 11/09/2011 Document Reviewed: 04/06/2008 [facility] Patient Information 2013 [place], [place].   We strongly recommend that you stop smoking.  Smoking increases the risk of surgery including infection in the form of an open wound, pus formation, abscess, hernia at an incision on the abdomen, etc.  You have an increased risk of other MAJOR complications such as stroke, heart attack, forming clots in the leg and/or lungs, and death.    While it can be one of the most difficult things to do, the [place] community has programs to help you stop.  Consider talking with your primary care physician about options.  Also, Smoking Cessation classes are available through the [facility]:  The smoking cessation program is a proven-effective program from the American Lung Association. The program is available for anyone 59 and older who currently smokes. The program lasts for 7 weeks and is 8 sessions. Each class will be approximately 1 1/2 hours. The program is every Tuesday.  All classes are 12-1:30pm and same location.  Event Location Information:  Location: [facility] 2nd Floor Conference Room 2-037; located next to [facility] cross streets: [address] [facility] Entrance into the [facility] is adjacent to the [facility] main entrance. The conference room is located on the 2nd floor.  Parking Instructions: Visitor parking is adjacent to [employer] main entrance and the [employer] [phone] or check the Classes and Support Groups   [email or web address] the event of inclemet weather please call [phone] or view online at [email or web address]  Smoking Cessation, Tips for Success YOU CAN QUIT SMOKING If you are ready to quit smoking, congratulations! You have chosen to help yourself be healthier. Cigarettes bring nicotine, tar, carbon monoxide, and  other irritants into your body. Your lungs, heart, and blood vessels will be able to work better without these poisons. There are many different ways to quit smoking. Nicotine gum, nicotine patches, a nicotine inhaler, or nicotine nasal spray can help with physical craving. Hypnosis, support groups, and medicines help break the habit of smoking. Here are some tips to help you quit for good.  Throw away all cigarettes.  Clean and remove all ashtrays from your home, work, and car.  On a card, write down your reasons for quitting. Carry the card with you and read it when you get the urge to smoke.  Cleanse your body of nicotine. Drink enough water and fluids to keep your urine clear or pale yellow. Do this after quitting to flush the nicotine from your body.  Learn to predict your moods. Do not let a bad situation be your excuse to have a cigarette. Some situations in your life might tempt you into wanting a cigarette.  Never have "just one" cigarette. It leads to wanting another and another. Remind yourself of your decision to quit.  Change habits associated with smoking. If you smoked while driving or when feeling stressed, try other activities to replace  smoking. Stand up when drinking your coffee. Brush your teeth after eating. Sit in a different chair when you read the paper. Avoid alcohol while trying to quit, and try to drink fewer caffeinated beverages. Alcohol and caffeine may urge you to smoke.  Avoid foods and drinks that can trigger a desire to smoke, such as sugary or spicy foods and alcohol.  Ask people who smoke not to smoke around you.  Have something planned to do right after eating or having a cup of coffee. Take a walk or exercise to perk you up. This will help to keep you from overeating.  Try a relaxation exercise to calm you down and decrease your stress. Remember, you may be tense and nervous for the first 2 weeks after you quit, but this will pass.  Find new activities to  keep your hands busy. Play with a pen, coin, or rubber band. Doodle or draw things on paper.  Brush your teeth right after eating. This will help cut down on the craving for the taste of tobacco after meals. You can try mouthwash, too.  Use oral substitutes, such as lemon drops, carrots, a cinnamon stick, or chewing gum, in place of cigarettes. Keep them handy so they are available when you have the urge to smoke.  When you have the urge to smoke, try deep breathing.  Designate your home as a nonsmoking area.  If you are a heavy smoker, ask your caregiver about a prescription for nicotine chewing gum. It can ease your withdrawal from nicotine.  Reward yourself. Set aside the cigarette money you save and buy yourself something nice.  Look for support from others. Join a support group or smoking cessation program. Ask someone at home or at work to help you with your plan to quit smoking.  Always ask yourself, "Do I need this cigarette or is this just a reflex?" Tell yourself, "Today, I choose not to smoke," or "I do not want to smoke." You are reminding yourself of your decision to quit, even if you do smoke a cigarette. HOW WILL I FEEL WHEN I QUIT SMOKING?  The benefits of not smoking start within days of quitting.  You may have symptoms of withdrawal because your body is used to nicotine (the addictive substance in cigarettes). You may crave cigarettes, be irritable, feel very hungry, cough often, get headaches, or have difficulty concentrating.  The withdrawal symptoms are only temporary. They are strongest when you first quit but will go away within 10 to 14 days.  When withdrawal symptoms occur, stay in control. Think about your reasons for quitting. Remind yourself that these are signs that your body is healing and getting used to being without cigarettes.  Remember that withdrawal symptoms are easier to treat than the major diseases that smoking can cause.  Even after the withdrawal  is over, expect periodic urges to smoke. However, these cravings are generally short-lived and will go away whether you smoke or not. Do not smoke!  If you relapse and smoke again, do not lose hope. Most smokers quit 3 times before they are successful.  If you relapse, do not give up! Plan ahead and think about what you will do the next time you get the urge to smoke. LIFE AS A NONSMOKER: MAKE IT FOR A MONTH, MAKE IT FOR LIFE Day 1: Hang this page where you will see it every day. Day 2: Get rid of all ashtrays, matches, and lighters. Day 3: Drink water. Breathe deeply  between sips. Day 4: Avoid places with smoke-filled air, such as bars, clubs, or the smoking section of restaurants. Day 5: Keep track of how much money you save by not smoking. Day 6: Avoid boredom. Keep a good book with you or go to the movies. Day 7: Reward yourself! One week without smoking! Day 8: Make a dental appointment to get your teeth cleaned. Day 9: Decide how you will turn down a cigarette before it is offered to you. Day 10: Review your reasons for quitting. Day 11: Distract yourself. Stay active to keep your mind off smoking and to relieve tension. Take a walk, exercise, read a book, do a crossword puzzle, or try a new hobby. Day 12: Exercise. Get off the bus before your stop or use stairs instead of escalators. Day 13: Call on friends for support and encouragement. Day 14: Reward yourself! Two weeks without smoking! Day 15: Practice deep breathing exercises. Day 16: Bet a friend that you can stay a nonsmoker. Day 17: Ask to sit in nonsmoking sections of restaurants. Day 18: Hang up "No Smoking" signs. Day 19: Think of yourself as a nonsmoker. Day 20: Each morning, tell yourself you will not smoke. Day 21: Reward yourself! Three weeks without smoking! Day 22: Think of smoking in negative ways. Remember how it stains your teeth, gives you bad breath, and leaves you short of breath. Day 23: Eat a nutritious  breakfast. Day 24:Do not relive your days as a smoker. Day 25: Hold a pencil in your hand when talking on the telephone. Day 26: Tell all your friends you do not smoke. Day 27: Think about how much better food tastes. Day 28: Remember, one cigarette is one too many. Day 29: Take up a hobby that will keep your hands busy. Day 30: Congratulations! One month without smoking! Give yourself a big reward. Your caregiver can direct you to community resources or hospitals for support, which may include:  Group support.  Education.  Hypnosis.  Subliminal therapy. Document Released: 05/15/2004 Document Revised: 11/09/2011 Document Reviewed: 06/03/2009 [facility] Patient Information 2013 [place], [place].  Managing Pain  Pain after surgery or related to activity is often due to strain/injury to muscle, tendon, nerves and/or incisions.  This pain is usually short-term and will improve in a few months.   Many people find it helpful to do the following things TOGETHER to help speed the process of healing and to get back to regular activity more quickly:  1. Avoid heavy physical activity a.  no lifting greater than 20 pounds b. Do not "push through" the pain.  Listen to your body and avoid positions and maneuvers than reproduce the pain c. Walking is okay as tolerated, but go slowly and stop when getting sore.  d. Remember: If it hurts to do it, then don't do it! 2. Take Anti-inflammatory medication  a. Take with food/snack around the clock for 1-2 weeks i. This helps the muscle and nerve tissues become less irritable and calm down faster b. Choose ONE of the following over-the-counter medications: i. Naproxen 220mg  tabs (ex. Aleve) 1-2 pills twice a day  ii. Ibuprofen 200mg  tabs (ex. Advil, Motrin) 3-4 pills with every meal and just before bedtime iii. Acetaminophen 500mg  tabs (Tylenol) 1-2 pills with every meal and just before bedtime 3. Use a Heating pad or Ice/Cold Pack a. 4-6 times a  day b. May use warm bath/hottub  or showers 4. Try Gentle Massage and/or Stretching  a. at the area of pain  many times a day b. stop if you feel pain - do not overdo it  Try these steps together to help you body heal faster and avoid making things get worse.  Doing just one of these things may not be enough.    If you are not getting better after two weeks or are noticing you are getting worse, contact our office for further advice; we may need to re-evaluate you & see what other things we can do to help.

## 2012-12-27 NOTE — Progress Notes (Signed)
Subjective:     Patient ID: Yesenia Miller, female   DOB: [birth year], 42 y.o.   MRN: [id number]  HPI   Yesenia Miller  [birth year] [id number]  Patient Care Team: Milus Height, PA-C as PCP - General (Nurse Practitioner)  This patient is a 42 y.o.female who presents today for surgical evaluation at the request of Yesenia Miller.   Reason for visit: Followup status post incision and drainage of right breast abscess with moderate cellulitis 12/05/2012.   GRAM STAIN  Few   GRAM STAIN  WBC present-both PMN and Mononuclear   GRAM STAIN  No Squamous Epithelial Cells Seen   GRAM STAIN  Rare Gram Positive Cocci In Pairs   Organism ID, Bacteria  Moderate STAPHYLOCOCCUS SPECIES (COAGULASE NEGATIVE)      Young smoking female.  No history of skin or breast problems.  Never breast-fed.  No falls or trauma. Developed a breast abscess with cellulitis.  I drained it 3 weeks ago.  She comes in today feeling better.  The wick fell out two days after her last visit.  Redness and tenderness and swelling have gone down a lot.  Much less sensitive.  She continues to smoke but is in the process of quitting.  She has cut back a moderate amount already.  No new areas of pain or drainage.  No nipple bleeding or discharge  Patient Active Problem List   Diagnosis Date Noted  . Abscess of breast, right, s/p I&D 12/05/2012 12/05/2012  . Tobacco abuse 12/05/2012    History reviewed. No pertinent past medical history.  Past Surgical History  Procedure Laterality Date  . Neck surgery    . Incision and drainage breast abscess      History   Social History  . Marital Status: Single    Spouse Name: N/A    Number of Children: N/A  . Years of Education: N/A   Occupational History  . Not on file.   Social History Main Topics  . Smoking status: Current Every Day Smoker -- 1.00 packs/day    Types: Cigarettes  . Smokeless tobacco: Not on file  . Alcohol Use: Yes     Comment: occasional  . Drug  Use: No  . Sexually Active: Yes    Birth Control/ Protection: None   Other Topics Concern  . Not on file   Social History Narrative  . No narrative on file    Family History  Problem Relation Age of Onset  . Diabetes Mother   . Thyroid disease Mother     Current Outpatient Prescriptions  Medication Sig Dispense Refill  . HYDROcodone-acetaminophen (NORCO/VICODIN) 5-325 MG per tablet Take 1-2 tablets by mouth every 6 (six) hours as needed for pain.  40 tablet  0  . ibuprofen (ADVIL,MOTRIN) 800 MG tablet Take 1 tablet (800 mg total) by mouth every 6 (six) hours as needed for pain or fever.  40 tablet  1   No current facility-administered medications for this visit.     No Known Allergies  BP 134/62  Pulse 60  Temp(Src) 97 F (36.1 C) (Temporal)  Resp 12  Ht 5\' 4"  (1.626 m)  Wt 152 lb (68.947 kg)  BMI 26.08 kg/m2  LMP 11/28/2012  No results found.   Review of Systems  Constitutional: Negative for fever, chills, diaphoresis, appetite change and fatigue.  HENT: Negative for ear pain, sore throat, trouble swallowing, neck pain and ear discharge.   Eyes: Negative for photophobia, discharge and visual disturbance.  Respiratory: Negative for  cough, choking, chest tightness and shortness of breath.   Cardiovascular: Negative for chest pain and palpitations.  Gastrointestinal: Negative for nausea, vomiting, abdominal pain, diarrhea, constipation, anal bleeding and rectal pain.  Genitourinary: Negative for dysuria, frequency and difficulty urinating.  Musculoskeletal: Negative for myalgias and gait problem.  Skin: Negative for color change, pallor and rash.  Neurological: Negative for dizziness, speech difficulty, weakness and numbness.  Hematological: Negative for adenopathy.  Psychiatric/Behavioral: Negative for confusion and agitation. The patient is not nervous/anxious.        Objective:   Physical Exam  Constitutional: She is oriented to person, place, and time. She  appears well-developed and well-nourished. No distress.  HENT:  Head: Normocephalic.  Mouth/Throat: Oropharynx is clear and moist. No oropharyngeal exudate.  Eyes: Conjunctivae and EOM are normal. Pupils are equal, round, and reactive to light. No scleral icterus.  Neck: Normal range of motion. Neck supple. No tracheal deviation present.  Cardiovascular: Normal rate, regular rhythm and intact distal pulses.   Pulmonary/Chest: Effort normal and breath sounds normal. No respiratory distress. She exhibits no mass and no tenderness. Right breast exhibits no inverted nipple, no nipple discharge and no skin change. Left breast exhibits no inverted nipple, no mass, no nipple discharge, no skin change and no tenderness.    Abdominal: Soft. She exhibits no distension and no mass. There is no tenderness. Hernia confirmed negative in the right inguinal area and confirmed negative in the left inguinal area.  Genitourinary: No vaginal discharge found.  Musculoskeletal: Normal range of motion. She exhibits no tenderness.  Lymphadenopathy:       Head (right side): No submental and no submandibular adenopathy present.       Head (left side): No submental and no submandibular adenopathy present.    She has no cervical adenopathy.    She has no axillary adenopathy.       Right: No inguinal adenopathy present.       Left: No inguinal adenopathy present.  Neurological: She is alert and oriented to person, place, and time. No cranial nerve deficit. She exhibits normal muscle tone. Coordination normal.  Skin: Skin is warm and dry. No rash noted. She is not diaphoretic. No erythema.  Psychiatric: She has a normal mood and affect. Her behavior is normal. Judgment and thought content normal.       Assessment:     Right breast abscess (CoagNeg Staph) and upper outer nipple with cellulitis - MUCH Improved now 3 weeks status post incision and drainage and oral antibiotics.  Tobacco abuse - working to quit      Plan:   I am glad she is feeling better.  I reassured her that swelling and lumpiness can take a few months to resolve, but it is made marked improvements.  She agrees.  We talked to the patient about the dangers of smoking.  I noted that his much higher risk to have a another breast abscess if she continues to smoke.  We stressed that tobacco use dramatically increases the risk of peri-operative complications such as infection, tissue necrosis leaving to problems with incision/wound and organ healing, heart attack, stroke, DVT, pulmonary embolism, and death.  We noted there are programs in our community to help stop smoking.

## 2013-03-15 ENCOUNTER — Other Ambulatory Visit (HOSPITAL_COMMUNITY)
Admission: RE | Admit: 2013-03-15 | Discharge: 2013-03-15 | Disposition: A | Discharge status: Home / Self Care | Payer: BC Managed Care – PPO | Source: Ambulatory Visit | Attending: Family Medicine | Admitting: Family Medicine

## 2013-03-15 ENCOUNTER — Other Ambulatory Visit: Payer: Self-pay | Admitting: Physician Assistant

## 2013-03-15 DIAGNOSIS — Z124 Encounter for screening for malignant neoplasm of cervix: Secondary | ICD-10-CM | POA: Insufficient documentation

## 2015-03-19 ENCOUNTER — Other Ambulatory Visit (HOSPITAL_COMMUNITY)
Admission: RE | Admit: 2015-03-19 | Discharge: 2015-03-19 | Disposition: A | Discharge status: Home / Self Care | Payer: Managed Care, Other (non HMO) | Source: Ambulatory Visit | Attending: Family Medicine | Admitting: Family Medicine

## 2015-03-19 ENCOUNTER — Other Ambulatory Visit: Payer: Self-pay | Admitting: Physician Assistant

## 2015-03-19 DIAGNOSIS — Z124 Encounter for screening for malignant neoplasm of cervix: Secondary | ICD-10-CM | POA: Insufficient documentation

## 2015-03-20 LAB — CYTOLOGY - PAP

## 2018-04-22 ENCOUNTER — Other Ambulatory Visit (HOSPITAL_COMMUNITY)
Admission: RE | Admit: 2018-04-22 | Discharge: 2018-04-22 | Disposition: A | Discharge status: Home / Self Care | Payer: Managed Care, Other (non HMO) | Source: Ambulatory Visit | Attending: Physician Assistant | Admitting: Physician Assistant

## 2018-04-22 ENCOUNTER — Other Ambulatory Visit: Payer: Self-pay | Admitting: Physician Assistant

## 2018-04-22 DIAGNOSIS — Z01411 Encounter for gynecological examination (general) (routine) with abnormal findings: Secondary | ICD-10-CM | POA: Diagnosis present

## 2018-04-25 ENCOUNTER — Other Ambulatory Visit: Payer: Self-pay | Admitting: Physician Assistant

## 2018-04-25 DIAGNOSIS — N939 Abnormal uterine and vaginal bleeding, unspecified: Secondary | ICD-10-CM

## 2018-04-27 ENCOUNTER — Ambulatory Visit
Admission: RE | Admit: 2018-04-27 | Discharge: 2018-04-27 | Disposition: A | Discharge status: Home / Self Care | Payer: Self-pay | Source: Ambulatory Visit | Attending: Physician Assistant | Admitting: Physician Assistant

## 2018-04-27 DIAGNOSIS — N939 Abnormal uterine and vaginal bleeding, unspecified: Secondary | ICD-10-CM

## 2018-04-27 LAB — CYTOLOGY - PAP
Adequacy: ABSENT
Diagnosis: NEGATIVE

## 2019-09-06 ENCOUNTER — Other Ambulatory Visit: Payer: Self-pay

## 2019-09-06 DIAGNOSIS — Z20822 Contact with and (suspected) exposure to covid-19: Secondary | ICD-10-CM

## 2019-09-08 LAB — NOVEL CORONAVIRUS, NAA: SARS-CoV-2, NAA: NOT DETECTED

## 2019-11-10 ENCOUNTER — Ambulatory Visit: Payer: 59 | Attending: Internal Medicine

## 2020-05-02 ENCOUNTER — Other Ambulatory Visit: Payer: Self-pay | Admitting: Orthopedic Surgery

## 2020-05-21 ENCOUNTER — Other Ambulatory Visit: Payer: Self-pay

## 2020-05-21 ENCOUNTER — Encounter (HOSPITAL_BASED_OUTPATIENT_CLINIC_OR_DEPARTMENT_OTHER): Payer: Self-pay | Admitting: Orthopedic Surgery

## 2020-05-23 ENCOUNTER — Other Ambulatory Visit (HOSPITAL_COMMUNITY): Payer: 59

## 2020-05-23 NOTE — Progress Notes (Signed)
Pt called inquiring about when she was scheduled for her covid testing. She was scheduled in the computer for Thurs 9/23, but stated that Tammy told her Friday. Pt requested to go Friday and I have changed appt day in epic.

## 2020-05-24 ENCOUNTER — Encounter (HOSPITAL_COMMUNITY): Payer: 59

## 2020-05-24 NOTE — Progress Notes (Signed)
Called Dr Merrilee Seashore office and informed Yesenia Miller that patient had not yet gone for covid test.Informed her that patient would need to go to [facility] testing site tomorrow between 9a-12n in order for surgery to proceed as scheduled for Monday.

## 2020-05-24 NOTE — Progress Notes (Signed)
Sent patient a text reminder to go for a covid test today by 3p. Provided address for the testing center.

## 2020-05-25 ENCOUNTER — Other Ambulatory Visit (HOSPITAL_COMMUNITY)
Admission: RE | Admit: 2020-05-25 | Discharge: 2020-05-25 | Disposition: A | Discharge status: Home / Self Care | Payer: 59 | Source: Ambulatory Visit | Attending: Orthopedic Surgery | Admitting: Orthopedic Surgery

## 2020-05-25 DIAGNOSIS — Z01812 Encounter for preprocedural laboratory examination: Secondary | ICD-10-CM | POA: Insufficient documentation

## 2020-05-25 DIAGNOSIS — Z20822 Contact with and (suspected) exposure to covid-19: Secondary | ICD-10-CM | POA: Insufficient documentation

## 2020-05-25 LAB — SARS CORONAVIRUS 2 (TAT 6-24 HRS): SARS Coronavirus 2: NEGATIVE

## 2020-05-27 ENCOUNTER — Ambulatory Visit (HOSPITAL_BASED_OUTPATIENT_CLINIC_OR_DEPARTMENT_OTHER)
Admission: RE | Admit: 2020-05-27 | Discharge: 2020-05-27 | Disposition: A | Discharge status: Home / Self Care | Payer: 59 | Attending: Orthopedic Surgery | Admitting: Orthopedic Surgery

## 2020-05-27 ENCOUNTER — Encounter (HOSPITAL_BASED_OUTPATIENT_CLINIC_OR_DEPARTMENT_OTHER): Payer: Self-pay | Admitting: Orthopedic Surgery

## 2020-05-27 ENCOUNTER — Other Ambulatory Visit: Payer: Self-pay

## 2020-05-27 ENCOUNTER — Ambulatory Visit (HOSPITAL_BASED_OUTPATIENT_CLINIC_OR_DEPARTMENT_OTHER): Payer: 59 | Admitting: Certified Registered"

## 2020-05-27 ENCOUNTER — Encounter (HOSPITAL_BASED_OUTPATIENT_CLINIC_OR_DEPARTMENT_OTHER)
Admission: RE | Disposition: A | Discharge status: Home / Self Care | Payer: Self-pay | Source: Home / Self Care | Attending: Orthopedic Surgery

## 2020-05-27 DIAGNOSIS — F17210 Nicotine dependence, cigarettes, uncomplicated: Secondary | ICD-10-CM | POA: Diagnosis not present

## 2020-05-27 DIAGNOSIS — F419 Anxiety disorder, unspecified: Secondary | ICD-10-CM | POA: Diagnosis not present

## 2020-05-27 DIAGNOSIS — Z79899 Other long term (current) drug therapy: Secondary | ICD-10-CM | POA: Insufficient documentation

## 2020-05-27 DIAGNOSIS — G5601 Carpal tunnel syndrome, right upper limb: Secondary | ICD-10-CM | POA: Diagnosis not present

## 2020-05-27 HISTORY — DX: Carpal tunnel syndrome, unspecified upper limb: G56.00

## 2020-05-27 HISTORY — DX: Anxiety disorder, unspecified: F41.9

## 2020-05-27 HISTORY — PX: CARPAL TUNNEL RELEASE: SHX101

## 2020-05-27 SURGERY — CARPAL TUNNEL RELEASE
Anesthesia: Regional | Site: Wrist | Laterality: Right

## 2020-05-27 MED ORDER — DEXAMETHASONE SODIUM PHOSPHATE 10 MG/ML IJ SOLN
INTRAMUSCULAR | Status: DC | PRN
  Administered 2020-05-27: 5 mg via INTRAVENOUS

## 2020-05-27 MED ORDER — MIDAZOLAM HCL 2 MG/2ML IJ SOLN
INTRAMUSCULAR | Status: AC
  Filled 2020-05-27: qty 2

## 2020-05-27 MED ORDER — LIDOCAINE-EPINEPHRINE 2 %-1:100000 IJ SOLN
INTRAMUSCULAR | Status: AC
  Filled 2020-05-27: qty 5.1

## 2020-05-27 MED ORDER — FENTANYL CITRATE (PF) 100 MCG/2ML IJ SOLN: 25.0000 ug | INTRAMUSCULAR | Status: DC | PRN

## 2020-05-27 MED ORDER — MIDAZOLAM HCL 5 MG/5ML IJ SOLN
INTRAMUSCULAR | Status: DC | PRN
  Administered 2020-05-27: 2 mg via INTRAVENOUS

## 2020-05-27 MED ORDER — BUPIVACAINE HCL (PF) 0.25 % IJ SOLN
INTRAMUSCULAR | Status: DC | PRN
  Administered 2020-05-27: 10 mL

## 2020-05-27 MED ORDER — LIDOCAINE 2% (20 MG/ML) 5 ML SYRINGE
INTRAMUSCULAR | Status: AC
  Filled 2020-05-27: qty 5

## 2020-05-27 MED ORDER — ONDANSETRON HCL 4 MG/2ML IJ SOLN
INTRAMUSCULAR | Status: DC | PRN
  Administered 2020-05-27: 4 mg via INTRAVENOUS

## 2020-05-27 MED ORDER — LIDOCAINE HCL (CARDIAC) PF 100 MG/5ML IV SOSY
PREFILLED_SYRINGE | INTRAVENOUS | Status: DC | PRN
  Administered 2020-05-27: 30 mL via INTRAVENOUS

## 2020-05-27 MED ORDER — HYDROCODONE-ACETAMINOPHEN 5-325 MG PO TABS
ORAL_TABLET | ORAL | 0 refills | Status: DC
Start: 2020-05-27 — End: 2021-10-01

## 2020-05-27 MED ORDER — PROPOFOL 10 MG/ML IV BOLUS
INTRAVENOUS | Status: AC
  Filled 2020-05-27: qty 20

## 2020-05-27 MED ORDER — PROPOFOL 10 MG/ML IV BOLUS
INTRAVENOUS | Status: AC
  Filled 2020-05-27: qty 40

## 2020-05-27 MED ORDER — CEFAZOLIN SODIUM-DEXTROSE 2-4 GM/100ML-% IV SOLN
INTRAVENOUS | Status: AC
  Filled 2020-05-27: qty 100

## 2020-05-27 MED ORDER — OXYCODONE HCL 5 MG/5ML PO SOLN: 5.0000 mg | Freq: Once | ORAL | Status: DC | PRN

## 2020-05-27 MED ORDER — LACTATED RINGERS IV SOLN: INTRAVENOUS | Status: DC

## 2020-05-27 MED ORDER — BUPIVACAINE HCL (PF) 0.25 % IJ SOLN
INTRAMUSCULAR | Status: AC
  Filled 2020-05-27: qty 30

## 2020-05-27 MED ORDER — FENTANYL CITRATE (PF) 100 MCG/2ML IJ SOLN
INTRAMUSCULAR | Status: DC | PRN
Start: 2020-05-27 — End: 2020-05-27
  Administered 2020-05-27 (×2): 50 ug via INTRAVENOUS

## 2020-05-27 MED ORDER — FENTANYL CITRATE (PF) 100 MCG/2ML IJ SOLN
INTRAMUSCULAR | Status: AC
  Filled 2020-05-27: qty 2

## 2020-05-27 MED ORDER — OXYCODONE HCL 5 MG PO TABS: 5.0000 mg | ORAL_TABLET | Freq: Once | ORAL | Status: DC | PRN

## 2020-05-27 MED ORDER — CEFAZOLIN SODIUM-DEXTROSE 2-4 GM/100ML-% IV SOLN
2.0000 g | INTRAVENOUS | Status: AC
  Administered 2020-05-27: 2 g via INTRAVENOUS

## 2020-05-27 MED ORDER — PROPOFOL 500 MG/50ML IV EMUL
INTRAVENOUS | Status: DC | PRN
  Administered 2020-05-27: 150 ug/kg/min via INTRAVENOUS

## 2020-05-27 MED ORDER — PROMETHAZINE HCL 25 MG/ML IJ SOLN: 6.2500 mg | INTRAMUSCULAR | Status: DC | PRN

## 2020-05-27 SURGICAL SUPPLY — 39 items
APL PRP STRL LF DISP 70% ISPRP (MISCELLANEOUS) ×1
BLADE SURG 15 STRL LF DISP TIS (BLADE) ×2 IMPLANT
BLADE SURG 15 STRL SS (BLADE) ×6
BNDG CMPR 9X4 STRL LF SNTH (GAUZE/BANDAGES/DRESSINGS)
BNDG ELASTIC 3X5.8 VLCR STR LF (GAUZE/BANDAGES/DRESSINGS) ×3 IMPLANT
BNDG ESMARK 4X9 LF (GAUZE/BANDAGES/DRESSINGS) IMPLANT
BNDG GAUZE ELAST 4 BULKY (GAUZE/BANDAGES/DRESSINGS) ×3 IMPLANT
CHLORAPREP W/TINT 26 (MISCELLANEOUS) ×3 IMPLANT
CORD BIPOLAR FORCEPS 12FT (ELECTRODE) ×3 IMPLANT
COVER BACK TABLE 60X90IN (DRAPES) ×3 IMPLANT
COVER MAYO STAND STRL (DRAPES) ×3 IMPLANT
COVER WAND RF STERILE (DRAPES) IMPLANT
CUFF TOURN SGL QUICK 18X4 (TOURNIQUET CUFF) ×3 IMPLANT
DRAPE EXTREMITY T 121X128X90 (DISPOSABLE) ×3 IMPLANT
DRAPE SURG 17X23 STRL (DRAPES) ×3 IMPLANT
DRSG PAD ABDOMINAL 8X10 ST (GAUZE/BANDAGES/DRESSINGS) ×3 IMPLANT
GAUZE SPONGE 4X4 12PLY STRL (GAUZE/BANDAGES/DRESSINGS) ×3 IMPLANT
GAUZE XEROFORM 1X8 LF (GAUZE/BANDAGES/DRESSINGS) ×3 IMPLANT
GLOVE BIO SURGEON STRL SZ 6.5 (GLOVE) ×2 IMPLANT
GLOVE BIO SURGEON STRL SZ7.5 (GLOVE) ×3 IMPLANT
GLOVE BIO SURGEONS STRL SZ 6.5 (GLOVE) ×1
GLOVE BIOGEL PI IND STRL 7.0 (GLOVE) ×1 IMPLANT
GLOVE BIOGEL PI IND STRL 8 (GLOVE) ×1 IMPLANT
GLOVE BIOGEL PI INDICATOR 7.0 (GLOVE) ×2
GLOVE BIOGEL PI INDICATOR 8 (GLOVE) ×2
GOWN STRL REUS W/ TWL LRG LVL3 (GOWN DISPOSABLE) ×1 IMPLANT
GOWN STRL REUS W/TWL LRG LVL3 (GOWN DISPOSABLE) ×3
GOWN STRL REUS W/TWL XL LVL3 (GOWN DISPOSABLE) ×3 IMPLANT
NEEDLE HYPO 25X1 1.5 SAFETY (NEEDLE) ×3 IMPLANT
NS IRRIG 1000ML POUR BTL (IV SOLUTION) ×3 IMPLANT
PACK BASIN DAY SURGERY FS (CUSTOM PROCEDURE TRAY) ×3 IMPLANT
PADDING CAST ABS 4INX4YD NS (CAST SUPPLIES) ×2
PADDING CAST ABS COTTON 4X4 ST (CAST SUPPLIES) ×1 IMPLANT
STOCKINETTE 4X48 STRL (DRAPES) ×3 IMPLANT
SUT ETHILON 4 0 PS 2 18 (SUTURE) ×3 IMPLANT
SYR BULB EAR ULCER 3OZ GRN STR (SYRINGE) ×3 IMPLANT
SYR CONTROL 10ML LL (SYRINGE) ×3 IMPLANT
TOWEL GREEN STERILE FF (TOWEL DISPOSABLE) ×6 IMPLANT
UNDERPAD 30X36 HEAVY ABSORB (UNDERPADS AND DIAPERS) ×3 IMPLANT

## 2020-05-27 NOTE — Anesthesia Procedure Notes (Signed)
Anesthesia Regional Block: Bier block (IV Regional)   Pre-Anesthetic Checklist: ,,, Correct Patient, Correct Site, Correct Laterality, Correct Procedure, Correct Position, site marked, risks and benefits discussed, Surgical consent,  Pre-op evaluation,  At surgeon's request and post-op pain management  Laterality: Right  Prep: alcohol swabs        Procedures:,,,,, intact distal pulses, Esmarch exsanguination, single tourniquet utilized, #20gu IV placed  Narrative:  Start time: 05/27/2020 1:46 PM End time: 05/27/2020 1:47 PM  Events:,, positive IV test,,,,,,,,

## 2020-05-27 NOTE — H&P (Signed)
  Yesenia Miller is an 49 y.o. female.   Chief Complaint: carpal tunnel syndrome HPI: 49 yo female with numbness and tingling bilateral hands.  Positive nerve conduction studies.  Nocturnal symptoms.  She wishes to have right carpal tunnel release.  Allergies: No Known Allergies  Past Medical History:  Diagnosis Date  . Anxiety   . CTS (carpal tunnel syndrome)    right    Past Surgical History:  Procedure Laterality Date  . INCISION AND DRAINAGE BREAST ABSCESS    . NECK SURGERY     c-spine    Family History: Family History  Problem Relation Age of Onset  . Diabetes Mother   . Thyroid disease Mother     Social History:   reports that she has been smoking cigarettes. She has been smoking about 1.00 pack per day. She has never used smokeless tobacco. She reports current alcohol use. She reports that she does not use drugs.  Medications: Medications Prior to Admission  Medication Sig Dispense Refill  . escitalopram (LEXAPRO) 10 MG tablet Take 10 mg by mouth in the morning and at bedtime.      No results found for this or any previous visit (from the past 48 hour(s)).  No results found.   A comprehensive review of systems was negative.  Blood pressure (!) 142/62, pulse (!) 51, temperature 98.1 F (36.7 C), temperature source Oral, resp. rate 16, height 5\' 5"  (1.651 m), weight 64.3 kg, last menstrual period 07/04/2019, SpO2 100 %.  General appearance: alert, cooperative and appears stated age Head: Normocephalic, without obvious abnormality, atraumatic Neck: supple, symmetrical, trachea midline Cardio: regular rate and rhythm Resp: clear to auscultation bilaterally Extremities: Intact sensation and capillary refill all digits.  +epl/fpl/io.  No wounds.  Pulses: 2+ and symmetric Skin: Skin color, texture, turgor normal. No rashes or lesions Neurologic: Grossly normal Incision/Wound: none  Assessment/Plan Right carpal tunnel syndrome.  Non operative and operative  treatment options have been discussed with the patient and patient wishes to proceed with operative treatment. Risks, benefits, and alternatives of surgery have been discussed and the patient agrees with the plan of care.   13/10/2018 05/27/2020, 12:53 PM

## 2020-05-27 NOTE — Transfer of Care (Signed)
Immediate Anesthesia Transfer of Care Note  Patient: Yesenia Miller  Procedure(s) Performed: CARPAL TUNNEL RELEASE (Right Wrist)  Patient Location: PACU  Anesthesia Type:MAC and Bier block  Level of Consciousness: awake, alert , oriented and patient cooperative  Airway & Oxygen Therapy: Patient Spontanous Breathing and Patient connected to face mask oxygen  Post-op Assessment: Report given to RN, Post -op Vital signs reviewed and stable and Patient moving all extremities  Post vital signs: Reviewed and stable  Last Vitals:  Vitals Value Taken Time  BP 128/70 05/27/20 1416  Temp    Pulse 44 05/27/20 1417  Resp 11 05/27/20 1417  SpO2 99 % 05/27/20 1417  Vitals shown include unvalidated device data.  Last Pain:  Vitals:   05/27/20 1140  TempSrc: Oral  PainSc: 0-No pain         Complications: No complications documented.

## 2020-05-27 NOTE — Anesthesia Postprocedure Evaluation (Signed)
Anesthesia Post Note  Patient: Kathlee Barnhardt  Procedure(s) Performed: CARPAL TUNNEL RELEASE (Right Wrist)     Patient location during evaluation: PACU Anesthesia Type: Bier Block Level of consciousness: awake and alert Pain management: pain level controlled Vital Signs Assessment: post-procedure vital signs reviewed and stable Respiratory status: spontaneous breathing, nonlabored ventilation, respiratory function stable and patient connected to nasal cannula oxygen Cardiovascular status: stable and blood pressure returned to baseline Postop Assessment: no apparent nausea or vomiting Anesthetic complications: no   No complications documented.  Last Vitals:  Vitals:   05/27/20 1415 05/27/20 1430  BP: 128/70 (!) 146/88  Pulse: (!) 55 (!) 54  Resp: 14 20  Temp: (!) 36.2 C   SpO2: 100% 100%    Last Pain:  Vitals:   05/27/20 1430  TempSrc:   PainSc: 0-No pain                 Shelton Silvas

## 2020-05-27 NOTE — Op Note (Signed)
05/27/2020 [facility]                              OPERATIVE REPORT   PREOPERATIVE DIAGNOSIS:  Right carpal tunnel syndrome.  POSTOPERATIVE DIAGNOSIS:  Right carpal tunnel syndrome.  PROCEDURE:  Right carpal tunnel release.  SURGEON:  Betha Loa, MD  ASSISTANT:  none.  ANESTHESIA: Bier block with sedation  IV FLUIDS:  Per anesthesia flow sheet.  ESTIMATED BLOOD LOSS:  Minimal.  COMPLICATIONS:  None.  SPECIMENS:  None.  TOURNIQUET TIME:    Total Tourniquet Time Documented: Forearm (Right) - -35 minutes Total: Forearm (Right) - -35 minutes   DISPOSITION:  Stable to PACU.  LOCATION: [facility]  INDICATIONS:  49 yo female with numbness and tingling right hand.  Positive nerve conduction studies.  Nocturnal symptoms.   She wishes to have a carpal tunnel release for management of her symptoms.  Risks, benefits and alternatives of surgery were discussed including the risk of blood loss; infection; damage to nerves, vessels, tendons, ligaments, bone; failure of surgery; need for additional surgery; complications with wound healing; continued pain; recurrence of carpal tunnel syndrome; and damage to motor branch. She voiced understanding of these risks and elected to proceed.   OPERATIVE COURSE:  After being identified preoperatively by myself, the patient and I agreed upon the procedure and site of procedure.  The surgical site was marked.  The risks, benefits, and alternatives of the surgery were reviewed and she wished to proceed.  Surgical consent had been signed.  She was given IV Ancef as preoperative antibiotic prophylaxis.  She was transferred to the operating room and placed on the operating room table in supine position with the Right upper extremity on an armboard.  Bier block anesthesia was induced by the anesthesiologist.  Right upper extremity was prepped and draped in normal sterile orthopaedic fashion.  A surgical pause was performed between  the surgeons, anesthesia, and operating room staff, and all were in agreement as to the patient, procedure, and site of procedure.  Tourniquet at the proximal aspect of the forearm had been inflated for the Bier block  Incision was made over the transverse carpal ligament and carried into the subcutaneous tissues by spreading technique.  Bipolar electrocautery was used to obtain hemostasis.  The palmar fascia was sharply incised.  The transverse carpal ligament was identified and sharply incised.  It was incised distally first.  The flexor tendons were identified.  The flexor tendon to the ring finger was identified and retracted radially.  The transverse carpal ligament was then incised proximally.  Scissors were used to split the distal aspect of the volar antebrachial fascia.  A finger was placed into the wound to ensure complete decompression, which was the case.  The nerve was examined.  It was flattened and hyperemic.  The motor branch was identified and was intact.  The wound was copiously irrigated with sterile saline.  It was then closed with 4-0 nylon in a horizontal mattress fashion.  It was injected with 0.25% plain Marcaine to aid in postoperative analgesia.  It was dressed with sterile Xeroform, 4x4s, an ABD, and wrapped with Kerlix and an Ace bandage.  Tourniquet was deflated at 35 minutes.  Fingertips were pink with brisk capillary refill after deflation of the tourniquet.  Operative drapes were broken down.  The patient was awoken from anesthesia safely.  She was transferred back to stretcher and taken  to the PACU in stable condition.  I will see her back in the office in 1 week for postoperative followup.  I will give her a prescription for Norco 5/325 1-2 tabs PO q6 hours prn pain, dispense # 20.    Betha Loa, MD Electronically signed, 05/27/20

## 2020-05-27 NOTE — Anesthesia Preprocedure Evaluation (Addendum)
Anesthesia Evaluation  Patient identified by MRN, date of birth, ID band Patient awake    Reviewed: Allergy & Precautions, NPO status , Patient's Chart, lab work & pertinent test results  History of Anesthesia Complications Negative for: history of anesthetic complications  Airway Mallampati: III  TM Distance: >3 FB Neck ROM: Full    Dental  (+) Dental Advisory Given, Teeth Intact   Pulmonary Current Smoker and Patient abstained from smoking.,    Pulmonary exam normal        Cardiovascular negative cardio ROS Normal cardiovascular exam     Neuro/Psych PSYCHIATRIC DISORDERS Anxiety  Carpal tunnel syndrome     GI/Hepatic negative GI ROS, Neg liver ROS,   Endo/Other  negative endocrine ROS  Renal/GU negative Renal ROS     Musculoskeletal negative musculoskeletal ROS (+)   Abdominal   Peds  Hematology negative hematology ROS (+)   Anesthesia Other Findings Covid test negative   Reproductive/Obstetrics                            Anesthesia Physical Anesthesia Plan  ASA: II  Anesthesia Plan: Bier Block and Bier Block-LIDOCAINE ONLY   Post-op Pain Management:    Induction: Intravenous  PONV Risk Score and Plan: 2 and Propofol infusion and Treatment may vary due to age or medical condition  Airway Management Planned: Natural Airway and Simple Face Mask  Additional Equipment: None  Intra-op Plan:   Post-operative Plan:   Informed Consent: I have reviewed the patients History and Physical, chart, labs and discussed the procedure including the risks, benefits and alternatives for the proposed anesthesia with the patient or authorized representative who has indicated his/her understanding and acceptance.       Plan Discussed with: CRNA and Anesthesiologist  Anesthesia Plan Comments:        Anesthesia Quick Evaluation

## 2020-05-27 NOTE — Discharge Instructions (Addendum)

## 2020-05-28 ENCOUNTER — Encounter (HOSPITAL_BASED_OUTPATIENT_CLINIC_OR_DEPARTMENT_OTHER): Payer: Self-pay | Admitting: Orthopedic Surgery

## 2020-07-16 IMAGING — US US PELVIS COMPLETE TRANSABD/TRANSVAG
1 series · 13 of 25 positions shown · non-contrast
Comparison: None

CLINICAL DATA: Patient with abnormal bleeding.  Heavy menses.



[Series 1: us pelvis complete transabd/transvag · 0.25mm/px · 13 of 45 slices shown]
[im 1/45]
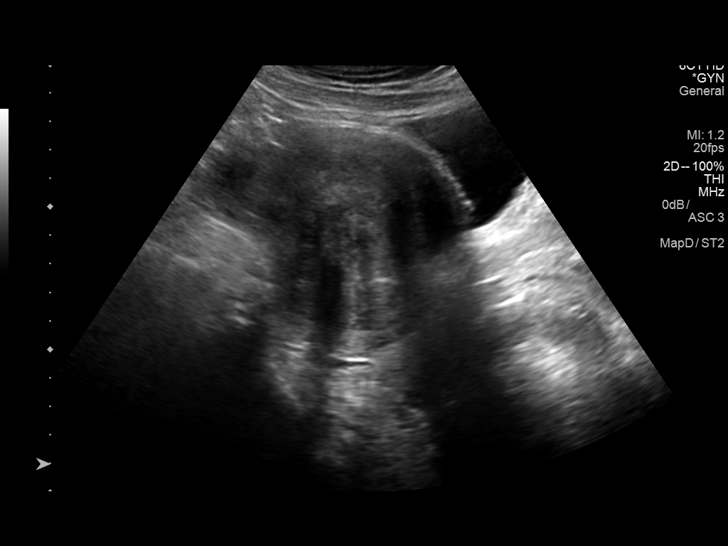
[im 4/45]
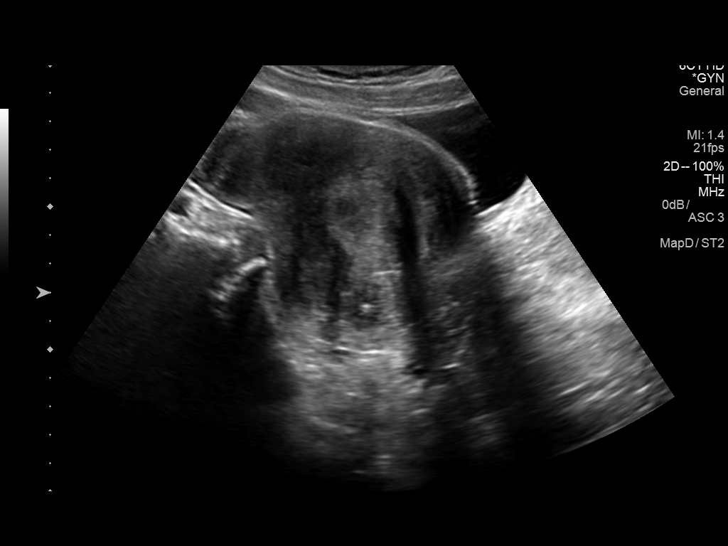
[im 8/45]
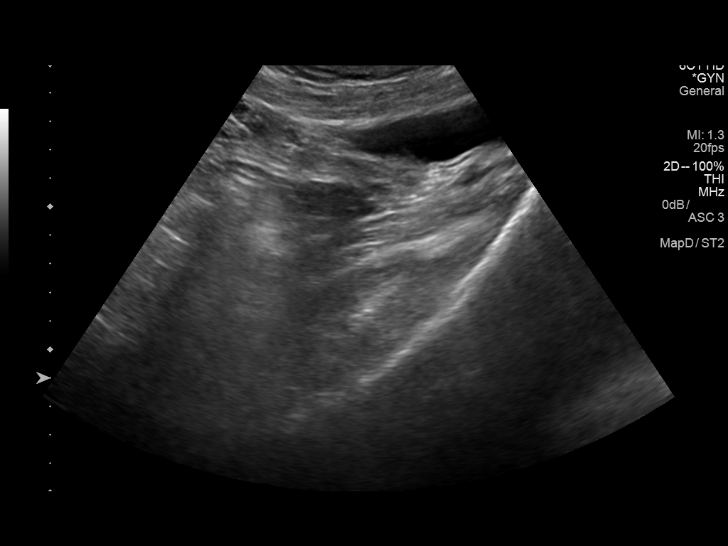
[im 12/45]
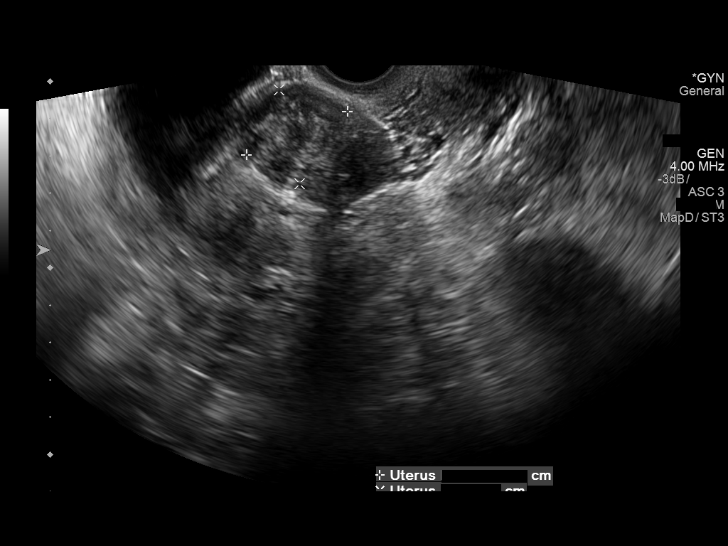
[im 15/45]
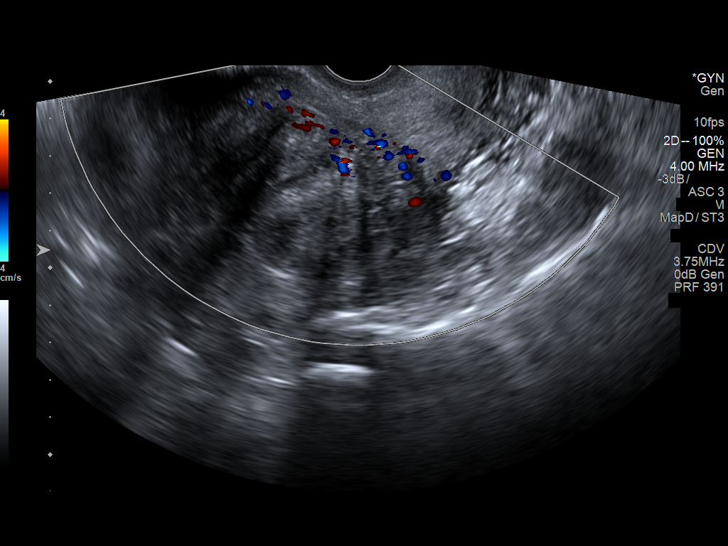
[im 19/45]
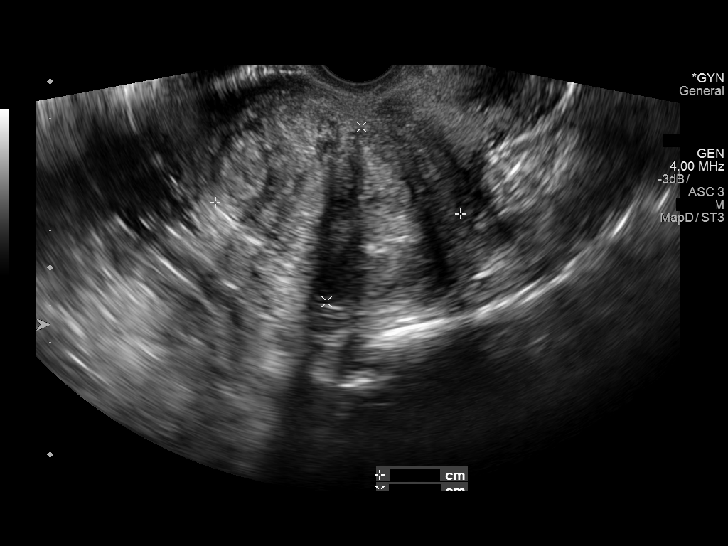
[im 23/45]
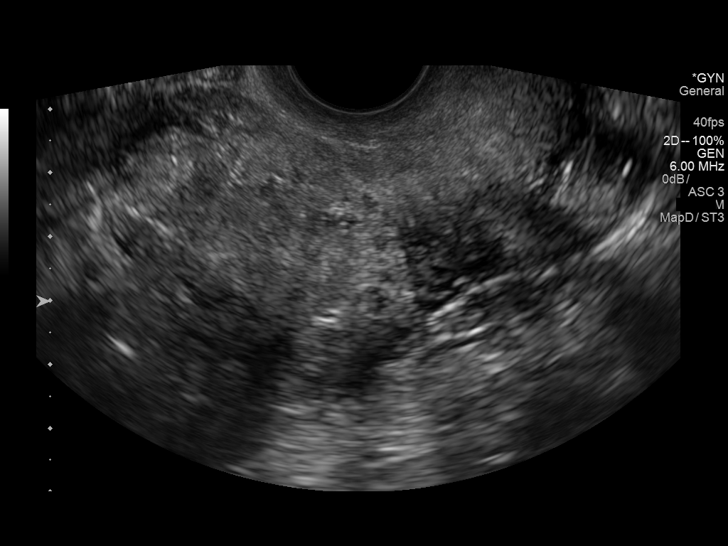
[im 26/45]
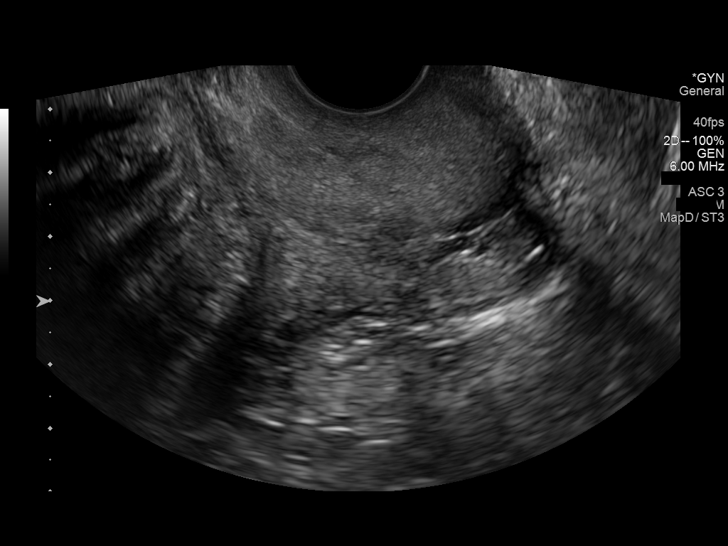
[im 30/45]
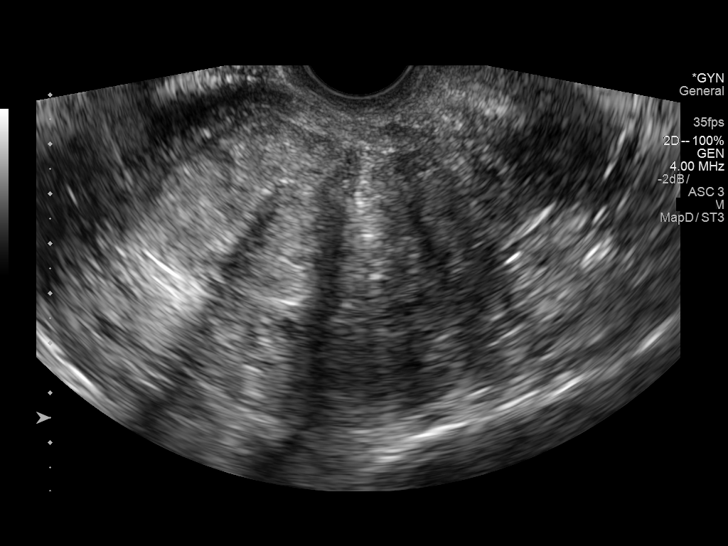
[im 34/45]
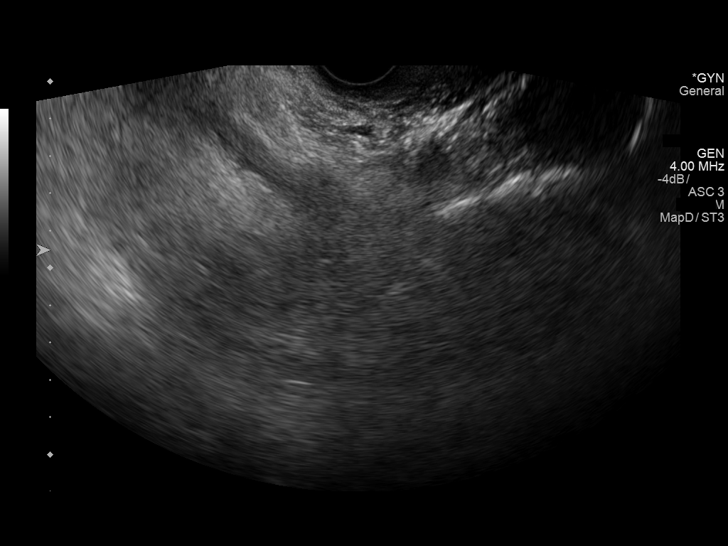
[im 37/45]
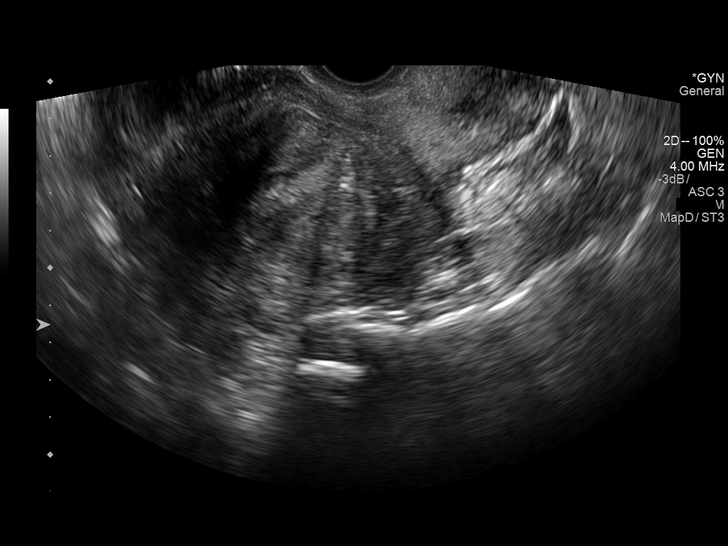
[im 41/45]
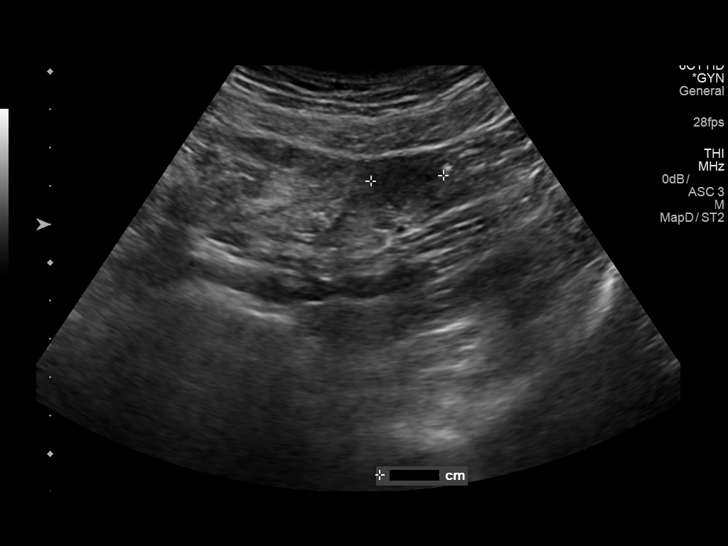
[im 45/45]
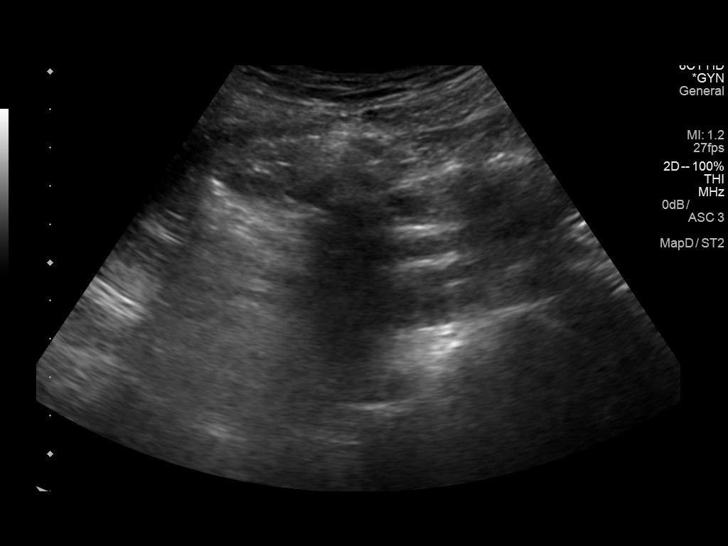

[13 of 25 positions shown; findings below may reference images not displayed]

FINDINGS: Uterus

Measurements: 11.3 x 5.6 x 8.3 cm. Multiple uterine fibroids. There
is a large 6.6 x 4.8 x 4.8 cm intramural fibroid within the right
aspect of the uterine body with a submucosal component. There is a
2.9 x 2.6 x 2.8 cm fibroid within the left aspect of the uterine
body.

Endometrium

Thickness: 10 mm.  Poorly visualized.

Right ovary

Not visualized.

Left ovary

Measurements: 3.0 x 1.3 x 1.9 cm. Normal appearance/no adnexal mass.

Other findings

No abnormal free fluid.
IMPRESSION: 1. Fibroid uterus. There is a large fibroid within the right aspect
of the uterine body with a submucosal component.
2. Poorly visualized endometrium. The portion that is visualized
measures approximately 10 mm. If bleeding remains unresponsive to
hormonal or medical therapy, sonohysterogram should be considered
for focal lesion work-up. (Ref: Radiological Reasoning: Algorithmic
Workup of Abnormal Vaginal Bleeding with Endovaginal Sonography and
Sonohysterography. AJR 3330; 191:S68-73)

## 2021-06-12 ENCOUNTER — Ambulatory Visit (INDEPENDENT_AMBULATORY_CARE_PROVIDER_SITE_OTHER): Payer: 59 | Admitting: Obstetrics & Gynecology

## 2021-06-12 ENCOUNTER — Other Ambulatory Visit: Payer: Self-pay

## 2021-06-12 ENCOUNTER — Encounter: Payer: Self-pay | Admitting: Obstetrics & Gynecology

## 2021-06-12 VITALS — BP 131/85 | HR 85 | Ht 65.0 in | Wt 148.5 lb

## 2021-06-12 DIAGNOSIS — F419 Anxiety disorder, unspecified: Secondary | ICD-10-CM

## 2021-06-12 MED ORDER — ESCITALOPRAM OXALATE 20 MG PO TABS: 20.0000 mg | ORAL_TABLET | Freq: Every day | ORAL | 2 refills | Status: DC

## 2021-06-12 NOTE — Progress Notes (Signed)
Patient ID: Yesenia Miller, female   DOB: October 30, 1970, 50 y.o.   MRN: [id number]  Patient needs PCP and routine care, self pay   HPI Yesenia Miller is a 50 y.o. female.  No LMP recorded. Patient is perimenopausal. Her last pap was normal with no HPV done 03/2018. She would like a referral to [facility] for cervical cancer and breast cancer screening.  HPI  Past Medical History:  Diagnosis Date   Anxiety    CTS (carpal tunnel syndrome)    right    Past Surgical History:  Procedure Laterality Date   CARPAL TUNNEL RELEASE Right 05/27/2020   Procedure: CARPAL TUNNEL RELEASE;  Surgeon: Betha Loa, MD;  Location: [facility];  Service: Orthopedics;  Laterality: Right;   INCISION AND DRAINAGE BREAST ABSCESS     NECK SURGERY     c-spine    Family History  Problem Relation Age of Onset   Diabetes Mother    Thyroid disease Mother     Social History Social History   Tobacco Use   Smoking status: Every Day    Packs/day: 1.00    Types: Cigarettes   Smokeless tobacco: Never  Substance Use Topics   Alcohol use: Yes    Comment: occasional   Drug use: No    No Known Allergies  Current Outpatient Medications  Medication Sig Dispense Refill   escitalopram (LEXAPRO) 20 MG tablet Take 1 tablet (20 mg total) by mouth daily. 30 tablet 2   HYDROcodone-acetaminophen (NORCO) 5-325 MG tablet 1-2 tabs po q6 hours prn pain (Patient not taking: Reported on 06/12/2021) 20 tablet 0   No current facility-administered medications for this visit.    Review of Systems Review of Systems  Constitutional: Negative.   Respiratory: Negative.    Cardiovascular: Negative.   Genitourinary: Negative.    Blood pressure 131/85, pulse 85, height 5\' 5"  (1.651 m), weight 148 lb 8 oz (67.4 kg).  Physical Exam Physical Exam Constitutional:      Appearance: Normal appearance.  Pulmonary:     Effort: Pulmonary effort is normal.  Musculoskeletal:     Cervical back: Normal range of  motion.  Psychiatric:        Mood and Affect: Mood normal.        Behavior: Behavior normal.    Data Reviewed Pap 2019 negative  Assessment Patient is due for breast and cervical cancer screen  Plan Referral was made to [facility] I refilled her Lexapro prescripton PCP f/u Meds ordered this encounter  Medications   escitalopram (LEXAPRO) 20 MG tablet    Sig: Take 1 tablet (20 mg total) by mouth daily.    Dispense:  30 tablet    Refill:  2      2020 06/12/2021, 9:43 AM

## 2021-06-26 NOTE — Telephone Encounter (Signed)
BH intern left HIPPA-compliant message to call back Jamie from [facility] at  [phone] (Jamie's office).   

## 2021-07-02 ENCOUNTER — Ambulatory Visit: Payer: Self-pay | Admitting: Clinical

## 2021-07-02 DIAGNOSIS — Z91199 Patient's noncompliance with other medical treatment and regimen due to unspecified reason: Secondary | ICD-10-CM

## 2021-07-02 NOTE — BH Specialist Note (Signed)
Patient no-showed today's appointment; appointment was for behavioral health consult with [facility] intern. Intern called patient via phone to see if she would join the televisit, but patient did not answer. A HIPPA message was left requesting patient to contact to reschedule.

## 2021-10-01 ENCOUNTER — Ambulatory Visit (INDEPENDENT_AMBULATORY_CARE_PROVIDER_SITE_OTHER): Payer: Self-pay

## 2021-10-01 ENCOUNTER — Ambulatory Visit (HOSPITAL_COMMUNITY)
Admission: EM | Admit: 2021-10-01 | Discharge: 2021-10-01 | Disposition: A | Discharge status: Home / Self Care | Payer: Self-pay | Attending: Family Medicine | Admitting: Family Medicine

## 2021-10-01 ENCOUNTER — Other Ambulatory Visit: Payer: Self-pay

## 2021-10-01 ENCOUNTER — Encounter (HOSPITAL_COMMUNITY): Payer: Self-pay | Admitting: Emergency Medicine

## 2021-10-01 DIAGNOSIS — R071 Chest pain on breathing: Secondary | ICD-10-CM

## 2021-10-01 DIAGNOSIS — R079 Chest pain, unspecified: Secondary | ICD-10-CM

## 2021-10-01 DIAGNOSIS — R0602 Shortness of breath: Secondary | ICD-10-CM

## 2021-10-01 DIAGNOSIS — F419 Anxiety disorder, unspecified: Secondary | ICD-10-CM

## 2021-10-01 DIAGNOSIS — R911 Solitary pulmonary nodule: Secondary | ICD-10-CM

## 2021-10-01 MED ORDER — DICLOFENAC SODIUM 75 MG PO TBEC: 75.0000 mg | DELAYED_RELEASE_TABLET | Freq: Two times a day (BID) | ORAL | 0 refills | Status: AC

## 2021-10-01 MED ORDER — ESCITALOPRAM OXALATE 20 MG PO TABS: 20.0000 mg | ORAL_TABLET | Freq: Every day | ORAL | 2 refills | Status: AC

## 2021-10-01 MED ORDER — METHOCARBAMOL 500 MG PO TABS: 500.0000 mg | ORAL_TABLET | Freq: Two times a day (BID) | ORAL | 0 refills | Status: DC

## 2021-10-01 NOTE — ED Triage Notes (Addendum)
Reports shortness of breath started last night.  Pain escalates with deep inspiration.  Denies recent illness.  Patient chest pain, touches upper chest.  Patient thought she had indigestion.  Patient has pain across top of right shoulder into right upper back.  Patient reports pain constant since started .  Intermittently was able to sleep last night.  Denies feeling this way before  Patient did fall 3 days ago, stumbled over a small table in house.    Reports bruises on knees.  Does not recall hitting chest

## 2021-10-01 NOTE — Discharge Instructions (Addendum)
You have been seen at the [facility] today for chest pain. Your evaluation today was not suggestive of any emergent condition requiring medical intervention at this time. Your chest x-ray and ECG (heart tracing) did not show any worrisome changes. However, some medical problems make take more time to appear. Therefore, it's very important that you pay attention to any new symptoms or worsening of your current condition.  Please proceed directly to the Emergency Department immediately should you feel worse in any way or have any of the following symptoms: increasing or different chest pain, pain that spreads to your arm, neck, jaw, back or abdomen, significant shortness of breath, or nausea and vomiting.

## 2021-10-01 NOTE — ED Provider Notes (Signed)
[facility]   [id number] 10/01/21 Arrival Time: 3846  ASSESSMENT & PLAN:  1. Chest pain, unspecified type   2. SOB (shortness of breath)   3. Anxiety   4. Incidental lung nodule     Patient history and exam consistent with non-cardiac cause of chest pain. Declines ECG. She feels like this may be related to her anxiety. Possible costochondritis explained.  I have personally viewed the imaging studies ordered this visit. No acute changes on CXR. No pneumothorax. See radiology report for incidental finding. Discussed with pt. Copy of radiology reported printed and given to pt.  Would like to start back on her anxiety medication.  Meds ordered this encounter  Medications   escitalopram (LEXAPRO) 20 MG tablet    Sig: Take 1 tablet (20 mg total) by mouth daily.    Dispense:  30 tablet    Refill:  2   diclofenac (VOLTAREN) 75 MG EC tablet    Sig: Take 1 tablet (75 mg total) by mouth 2 (two) times daily.    Dispense:  14 tablet    Refill:  0   methocarbamol (ROBAXIN) 500 MG tablet    Sig: Take 1 tablet (500 mg total) by mouth 2 (two) times daily.    Dispense:  20 tablet    Refill:  0    Chest pain precautions given.    Discharge Instructions      You have been seen at the [facility] today for chest pain. Your evaluation today was not suggestive of any emergent condition requiring medical intervention at this time. Your chest x-ray and ECG (heart tracing) did not show any worrisome changes. However, some medical problems make take more time to appear. Therefore, it's very important that you pay attention to any new symptoms or worsening of your current condition.  Please proceed directly to the Emergency Department immediately should you feel worse in any way or have any of the following symptoms: increasing or different chest pain, pain that spreads to your arm, neck, jaw, back or abdomen, significant shortness of breath, or nausea and  vomiting.      Reviewed expectations re: course of current medical issues. Questions answered. Outlined signs and symptoms indicating need for more acute intervention. Patient verbalized understanding. After Visit Summary given.   SUBJECTIVE:  History from: patient. Yesenia Miller is a 51 y.o. female who presents with complaint of intermittent non-radiating R upper chest wall pain. Worse with deep breaths where she does report feeling SOB. No chest injury reported. Without associated n/v/diaphoresis. Denies: irregular heart beat, lower extremity edema, near-syncope, orthopnea, palpitations, paroxysmal nocturnal dyspnea, and syncope. Aggravating factors: have not been identified. Alleviating factors: have not been identified. Recent illnesses: none. Fever: absent. Ambulatory without assistance. No tx PTA. Recreational drug use: denied.  Social History   Tobacco Use  Smoking Status Every Day   Packs/day: 1.00   Types: Cigarettes  Smokeless Tobacco Never   Social History   Substance and Sexual Activity  Alcohol Use Yes   Comment: occasional     OBJECTIVE:  Vitals:   10/01/21 1101 10/01/21 1102  BP:  132/80  Pulse:  98  Resp: 18 20  Temp:  99.3 F (37.4 C)  TempSrc:  Oral  SpO2:  99%    General appearance: alert, oriented, no acute distress Eyes: PERRLA; EOMI; conjunctivae normal HENT: normocephalic; atraumatic Neck: supple with FROM Lungs: without labored respirations; speaks full sentences without difficulty; CTAB Heart: regular rate and rhythm without murmer  Chest Wall: without tenderness to palpation Abdomen: soft, non-tender; no guarding or rebound tenderness Extremities: without edema; without calf swelling or tenderness; symmetrical without gross deformities Skin: warm and dry; without rash or lesions Neuro: normal gait Psychological: alert and cooperative; normal mood and affect  Imaging: DG Chest 2 View  Result Date: 10/01/2021 CLINICAL DATA:   Chest pain and shortness of breath with inspiration. EXAM: CHEST - 2 VIEW COMPARISON:  None. FINDINGS: The cardiopericardial silhouette is within normal limits for size. Lungs appear clear on the PA film but there appears to be increased density superimposed on the spine and right hemidiaphragm on the lateral projection. The visualized bony structures of the thorax show no acute abnormality. IMPRESSION: Possible airspace disease or nodule at the deep posterior left costophrenic sulcus. Recommend follow-up CT chest without contrast to further evaluate. Electronically Signed   By: Kennith Center M.D.   On: 10/01/2021 11:30     No Known Allergies  Past Medical History:  Diagnosis Date   Anxiety    CTS (carpal tunnel syndrome)    right   Social History   Socioeconomic History   Marital status: Single    Spouse name: Not on file   Number of children: Not on file   Years of education: Not on file   Highest education level: Not on file  Occupational History   Not on file  Tobacco Use   Smoking status: Every Day    Packs/day: 1.00    Types: Cigarettes   Smokeless tobacco: Never  Vaping Use   Vaping Use: Never used  Substance and Sexual Activity   Alcohol use: Yes    Comment: occasional   Drug use: No   Sexual activity: Yes    Birth control/protection: Condom    Comment: no periods since 07-2019  Other Topics Concern   Not on file  Social History Narrative   Not on file   Social Determinants of Health   Financial Resource Strain: Not on file  Food Insecurity: Not on file  Transportation Needs: Not on file  Physical Activity: Not on file  Stress: Not on file  Social Connections: Not on file  Intimate Partner Violence: Not on file   Family History  Problem Relation Age of Onset   Diabetes Mother    Thyroid disease Mother    Past Surgical History:  Procedure Laterality Date   CARPAL TUNNEL RELEASE Right 05/27/2020   Procedure: CARPAL TUNNEL RELEASE;  Surgeon: Betha Loa,  MD;  Location: [facility];  Service: Orthopedics;  Laterality: Right;   INCISION AND DRAINAGE BREAST ABSCESS     NECK SURGERY     c-spine      Mardella Layman, MD 10/01/21 1459

## 2021-10-28 ENCOUNTER — Encounter (HOSPITAL_COMMUNITY): Payer: Self-pay | Admitting: Radiology

## 2022-02-04 ENCOUNTER — Other Ambulatory Visit: Payer: Self-pay | Admitting: Physician Assistant

## 2022-02-04 DIAGNOSIS — Z1231 Encounter for screening mammogram for malignant neoplasm of breast: Secondary | ICD-10-CM

## 2022-04-02 ENCOUNTER — Ambulatory Visit: Payer: Self-pay | Admitting: *Deleted

## 2022-04-02 VITALS — BP 105/69 | Wt 143.3 lb

## 2022-04-02 DIAGNOSIS — Z1211 Encounter for screening for malignant neoplasm of colon: Secondary | ICD-10-CM

## 2022-04-02 DIAGNOSIS — Z1239 Encounter for other screening for malignant neoplasm of breast: Secondary | ICD-10-CM

## 2022-04-02 NOTE — Patient Instructions (Signed)
Explained breast self awareness with Boykin Reaper. Patient did not need a Pap smear today due to last Pap smear was in February 2023 per patient. Let her know [facility] will cover Pap smears every 3 years unless has a history of abnormal Pap smears. Referred patient to [facility] for a screening mammogram. Appointment scheduled Thursday, April 02, 2022 at 1545. Patient aware of appointment and will be there. Let patient know Garald Braver will follow up with her within the next couple weeks with results of her mammogram by letter or phone. Discussed smoking cessation with patient and referred to the [facility] Quitline. Jackeline Gutknecht verbalized understanding.  Javonnie Illescas, Kathaleen Maser, RN 2:27 PM

## 2022-04-02 NOTE — Progress Notes (Signed)
[name] is a 50 y.o. female who presents to [facility] clinic today with no complaints.    Pap Smear: Pap smear not completed today. Last Pap smear was in February 2023 at [facility] at the [facility] and was normal per patient. Per patient has no history of an abnormal Pap smear. Last Pap smear result is not available in Epic.   Physical exam: Breasts Breasts symmetrical. No skin abnormalities bilateral breasts. No nipple retraction bilateral breasts. No nipple discharge bilateral breasts. No lymphadenopathy. No lumps palpated bilateral breasts. No complaints of pain or tenderness on exam.   Pelvic/Bimanual Pap is not indicated today per BCCCP guidelines.   Smoking History: Patient is a current smoker. Discussed smoking cessation with patient and referred to the [facility] Quitline.   Patient Navigation: Patient education provided. Access to services provided for patient through [facility] program.   Colorectal Cancer Screening: Per patient had a colonoscopy completed in 2019. FIT Test given to patient to complete. No complaints today.    Breast and Cervical Cancer Risk Assessment: Patient does not have family history of breast cancer, known genetic mutations, or radiation treatment to the chest before age 66. Patient does not have history of cervical dysplasia, immunocompromised, or DES exposure in-utero.  Risk Assessment     Risk Scores       04/02/2022   Last edited by: Yesenia Heidelberg, RN   5-year risk: 1 %   Lifetime risk: 6.5 %           A: BCCCP exam without pap smear No complaints.  P: Referred patient to [facility] for a screening mammogram. Appointment scheduled Thursday, April 02, 2022 at 1545.  Yesenia Heidelberg, RN 04/02/2022 2:27 PM

## 2022-10-07 IMAGING — DX DG CHEST 2V
2 series · 2 of 2 positions shown · non-contrast
Comparison: None.

CLINICAL DATA: Chest pain and shortness of breath with inspiration.

EXAM:
CHEST - 2 VIEW

[chest pa]
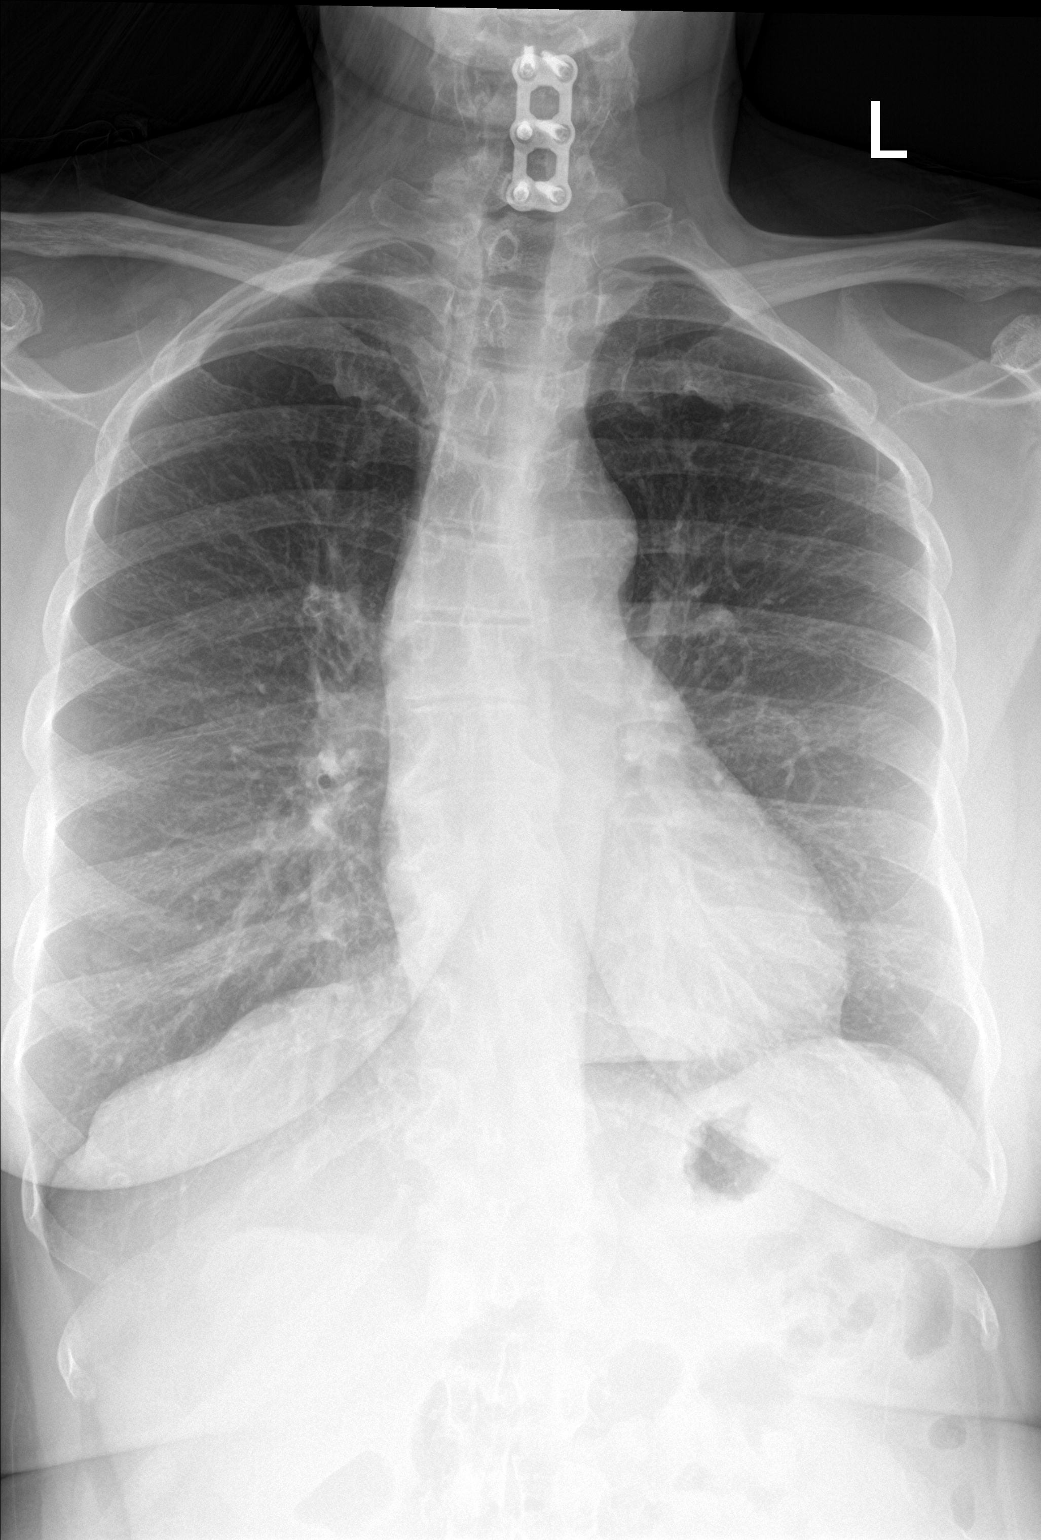

[chest lat]
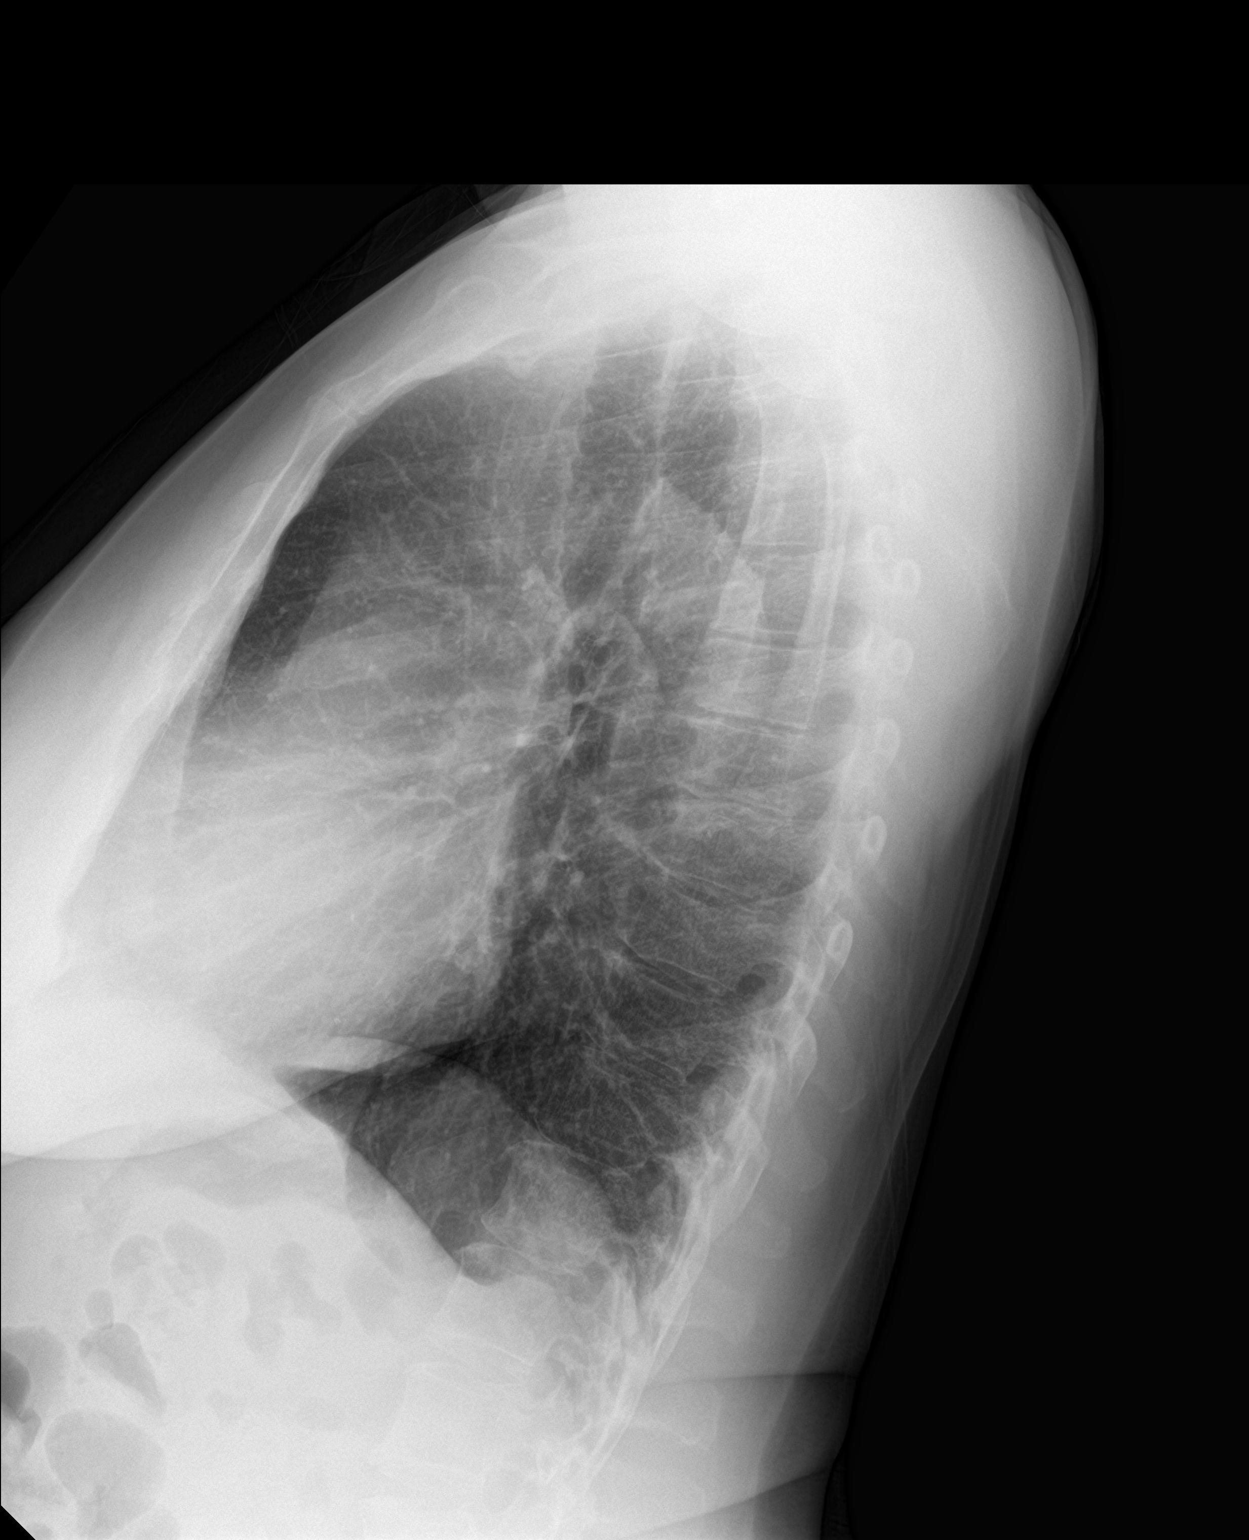

[2 of 2 positions shown; findings below may reference images not displayed]

FINDINGS: The cardiopericardial silhouette is within normal limits for size.
Lungs appear clear on the PA film but there appears to be increased
density superimposed on the spine and right hemidiaphragm on the
lateral projection. The visualized bony structures of the thorax
show no acute abnormality.
IMPRESSION: Possible airspace disease or nodule at the deep posterior left
costophrenic sulcus. Recommend follow-up CT chest without contrast
to further evaluate.

## 2023-02-09 ENCOUNTER — Telehealth: Payer: Self-pay | Admitting: *Deleted

## 2023-04-08 ENCOUNTER — Ambulatory Visit: Payer: Self-pay

## 2023-04-15 ENCOUNTER — Other Ambulatory Visit (HOSPITAL_COMMUNITY): Payer: Self-pay

## 2023-04-15 ENCOUNTER — Ambulatory Visit: Payer: Self-pay

## 2023-04-15 ENCOUNTER — Ambulatory Visit: Payer: Self-pay | Admitting: Hematology and Oncology

## 2023-04-15 VITALS — Wt 141.9 lb

## 2023-04-15 DIAGNOSIS — Z01419 Encounter for gynecological examination (general) (routine) without abnormal findings: Secondary | ICD-10-CM

## 2023-04-15 DIAGNOSIS — Z1211 Encounter for screening for malignant neoplasm of colon: Secondary | ICD-10-CM

## 2023-04-15 DIAGNOSIS — Z1231 Encounter for screening mammogram for malignant neoplasm of breast: Secondary | ICD-10-CM

## 2023-04-15 MED ORDER — ESCITALOPRAM OXALATE 20 MG PO TABS
20.0000 mg | ORAL_TABLET | Freq: Every day | ORAL | 3 refills | Status: DC
  Filled 2023-04-15: qty 90, 90d supply, fill #0
  Filled 2023-06-16 – 2023-07-06 (×2): qty 90, 90d supply, fill #1
  Filled 2023-10-06: qty 90, 90d supply, fill #2
  Filled 2024-01-09: qty 90, 90d supply, fill #3

## 2023-04-15 NOTE — Progress Notes (Signed)
Ms. Carnell Bronner is a 52 y.o. female who presents to [facility] clinic today with no complaints.    Pap Smear: Pap not smear completed today. Last Pap smear was 01/19/2022 and was normal. Per patient has no history of an abnormal Pap smear. Last Pap smear result is available in Epic.   Physical exam: Breasts Breasts symmetrical. No skin abnormalities bilateral breasts. No nipple retraction bilateral breasts. No nipple discharge bilateral breasts. No lymphadenopathy. No lumps palpated bilateral breasts.       Pelvic/Bimanual Pap is not indicated today    Smoking History: Patient has is a current smoker at 1 packs per day and was referred to quit line.    Patient Navigation: Patient education provided. Access to services provided for patient through [facility] program. No interpreter provided. No transportation provided   Colorectal Cancer Screening: Per patient has never had colonoscopy completed No complaints today. FIT test given.   Breast and Cervical Cancer Risk Assessment: Patient does not have family history of breast cancer, known genetic mutations, or radiation treatment to the chest before age 9. Patient does not have history of cervical dysplasia, immunocompromised, or DES exposure in-utero.  Risk Assessment   No risk assessment data for the current encounter  Risk Scores       04/02/2022   Last edited by: Priscille Heidelberg, RN   5-year risk: 1%   Lifetime risk: 6.5%            A: BCCCP exam without pap smear No complaints with benign exam.   P: Referred patient to [facility] for a screening mammogram. Appointment scheduled 04/15/23.  Ilda Basset A, NP 04/15/2023 11:02 AM

## 2023-04-15 NOTE — Patient Instructions (Signed)
Taught Boykin Reaper about self breast awareness and gave educational materials to take home. Patient did not need a Pap smear today due to last Pap smear was in 01/20/22 per patient.  Let her know BCCCP will cover Pap smears every 5 years unless has a history of abnormal Pap smears. Referred patient to the [facility] for screening mammogram. Appointment scheduled for 04/15/23. Patient aware of appointment and will be there. Let patient know will follow up with her within the next couple weeks with results. Udy Pore verbalized understanding.  Pascal Lux, NP 11:57 AM

## 2023-04-19 ENCOUNTER — Encounter: Payer: Self-pay | Admitting: Obstetrics and Gynecology

## 2023-06-16 ENCOUNTER — Other Ambulatory Visit (HOSPITAL_COMMUNITY): Payer: Self-pay

## 2023-06-25 LAB — FECAL OCCULT BLOOD, IMMUNOCHEMICAL: Fecal Occult Bld: NEGATIVE

## 2023-06-29 ENCOUNTER — Other Ambulatory Visit (HOSPITAL_COMMUNITY): Payer: Self-pay

## 2023-07-06 ENCOUNTER — Other Ambulatory Visit (HOSPITAL_COMMUNITY): Payer: Self-pay

## 2023-07-22 ENCOUNTER — Other Ambulatory Visit: Payer: Self-pay

## 2023-07-22 ENCOUNTER — Emergency Department (HOSPITAL_BASED_OUTPATIENT_CLINIC_OR_DEPARTMENT_OTHER): Payer: Self-pay | Admitting: Radiology

## 2023-07-22 ENCOUNTER — Emergency Department (HOSPITAL_BASED_OUTPATIENT_CLINIC_OR_DEPARTMENT_OTHER)
Admission: EM | Admit: 2023-07-22 | Discharge: 2023-07-22 | Disposition: A | Discharge status: Home / Self Care | Payer: No Typology Code available for payment source | Attending: Emergency Medicine | Admitting: Emergency Medicine

## 2023-07-22 ENCOUNTER — Emergency Department (HOSPITAL_BASED_OUTPATIENT_CLINIC_OR_DEPARTMENT_OTHER): Payer: Self-pay

## 2023-07-22 DIAGNOSIS — M542 Cervicalgia: Secondary | ICD-10-CM | POA: Diagnosis not present

## 2023-07-22 DIAGNOSIS — R519 Headache, unspecified: Secondary | ICD-10-CM | POA: Insufficient documentation

## 2023-07-22 DIAGNOSIS — Y92410 Unspecified street and highway as the place of occurrence of the external cause: Secondary | ICD-10-CM | POA: Diagnosis not present

## 2023-07-22 DIAGNOSIS — M545 Low back pain, unspecified: Secondary | ICD-10-CM | POA: Insufficient documentation

## 2023-07-22 MED ORDER — ACETAMINOPHEN 500 MG PO TABS
1000.0000 mg | ORAL_TABLET | Freq: Once | ORAL | Status: AC
  Administered 2023-07-22: 1000 mg via ORAL
  Filled 2023-07-22: qty 2

## 2023-07-22 MED ORDER — IBUPROFEN 400 MG PO TABS
400.0000 mg | ORAL_TABLET | Freq: Once | ORAL | Status: AC | PRN
  Administered 2023-07-22: 400 mg via ORAL
  Filled 2023-07-22: qty 1

## 2023-07-22 MED ORDER — OXYCODONE HCL 5 MG PO TABS
5.0000 mg | ORAL_TABLET | Freq: Once | ORAL | Status: AC
  Administered 2023-07-22: 5 mg via ORAL
  Filled 2023-07-22: qty 1
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
## 2023-08-05 ENCOUNTER — Telehealth: Payer: Self-pay

## 2023-08-05 NOTE — Telephone Encounter (Signed)
Patient called, will fax copy of bill. BCCCP

## 2023-10-06 ENCOUNTER — Other Ambulatory Visit (HOSPITAL_COMMUNITY): Payer: Self-pay

## 2023-11-02 ENCOUNTER — Other Ambulatory Visit (HOSPITAL_COMMUNITY): Payer: Self-pay

## 2023-11-02 ENCOUNTER — Other Ambulatory Visit: Payer: Self-pay

## 2023-11-02 MED ORDER — DICLOFENAC SODIUM 75 MG PO TBEC
75.0000 mg | DELAYED_RELEASE_TABLET | Freq: Two times a day (BID) | ORAL | 2 refills | Status: DC | PRN
  Filled 2023-11-02 (×2): qty 60, 30d supply, fill #0

## 2023-11-02 MED ORDER — HYDROXYZINE HCL 25 MG PO TABS
25.0000 mg | ORAL_TABLET | Freq: Every evening | ORAL | 2 refills | Status: DC | PRN
  Filled 2023-11-02 (×2): qty 30, 30d supply, fill #0
  Filled 2023-12-15: qty 30, 30d supply, fill #1
  Filled 2024-03-20: qty 30, 30d supply, fill #2

## 2023-11-02 MED ORDER — TIZANIDINE HCL 4 MG PO TABS
4.0000 mg | ORAL_TABLET | Freq: Two times a day (BID) | ORAL | 2 refills | Status: DC | PRN
  Filled 2023-11-02 (×2): qty 30, 15d supply, fill #0

## 2023-11-03 ENCOUNTER — Other Ambulatory Visit: Payer: Self-pay

## 2023-11-16 ENCOUNTER — Other Ambulatory Visit: Payer: Self-pay

## 2023-11-16 ENCOUNTER — Other Ambulatory Visit (HOSPITAL_COMMUNITY): Payer: Self-pay

## 2023-11-16 MED ORDER — FLUTICASONE PROPIONATE 50 MCG/ACT NA SUSP
2.0000 | Freq: Every day | NASAL | 5 refills | Status: AC
  Filled 2023-11-16: qty 16, 30d supply, fill #0
  Filled 2023-12-15: qty 16, 30d supply, fill #1
  Filled 2024-01-21 – 2024-05-24 (×2): qty 16, 30d supply, fill #2

## 2023-11-16 MED ORDER — CETIRIZINE HCL 10 MG PO TABS
10.0000 mg | ORAL_TABLET | Freq: Every evening | ORAL | 2 refills | Status: DC
  Filled 2023-11-16: qty 30, 30d supply, fill #0
  Filled 2023-12-15: qty 30, 30d supply, fill #1
  Filled 2024-01-14: qty 30, 30d supply, fill #2

## 2023-11-16 MED ORDER — ATORVASTATIN CALCIUM 20 MG PO TABS
20.0000 mg | ORAL_TABLET | Freq: Every evening | ORAL | 5 refills | Status: AC
  Filled 2023-11-16: qty 30, 30d supply, fill #0
  Filled 2023-12-15: qty 30, 30d supply, fill #1
  Filled 2024-01-14: qty 30, 30d supply, fill #2
  Filled 2024-02-17: qty 30, 30d supply, fill #3
  Filled 2024-03-20: qty 30, 30d supply, fill #4
  Filled 2024-05-24: qty 30, 30d supply, fill #5

## 2023-11-25 ENCOUNTER — Encounter: Payer: Self-pay | Admitting: Neurology

## 2023-11-26 ENCOUNTER — Telehealth: Payer: Self-pay | Admitting: Gastroenterology

## 2023-11-26 ENCOUNTER — Other Ambulatory Visit: Payer: Self-pay

## 2023-11-26 DIAGNOSIS — R202 Paresthesia of skin: Secondary | ICD-10-CM

## 2023-11-26 NOTE — Telephone Encounter (Signed)
 Good afternoon Dr. Meridee Score,   Doc of Day PM 3/28   We received a referral for patient to have a colonoscopy. Patient last had a colonoscopy in 2021 with [facility]. Patient is requesting to transfer her care due to her primary care provider referring patient to continue her care with [facility]. Patient's previous records are in [employer] for you to review and advise on scheduling.    Thank yiu.

## 2023-11-26 NOTE — Telephone Encounter (Signed)
 Will review chart when I return to outpatient clinic.

## 2023-11-27 NOTE — Telephone Encounter (Signed)
 2021 colonoscopy 20 mm polyp in the descending colon, removed with injecting lift and hot snare. Diverticulosis in sigmoid colon. The distal rectum and anal verge are normal.  Pathology Tubulovillous adenoma  These results should be scanned into the colonoscopy procedure records if possible.  Patient is due for colonoscopy due to history of advanced adenoma. Patient can be scheduled for next available direct [facility] colonoscopy. Should patient want to be seen in clinic first, can be seen by any of the APP's in pod C or myself.

## 2023-11-30 ENCOUNTER — Encounter: Payer: Self-pay | Admitting: Gastroenterology

## 2023-12-15 ENCOUNTER — Ambulatory Visit: Admitting: Physician Assistant

## 2023-12-15 ENCOUNTER — Encounter: Payer: Self-pay | Admitting: Physician Assistant

## 2023-12-15 NOTE — Progress Notes (Unsigned)
 Pt presents for annual and to est. Care. Pt states that she has not had a period in 10 years and started bleeding on 4/10 and stopped on 4/15.

## 2023-12-16 ENCOUNTER — Other Ambulatory Visit: Payer: Self-pay

## 2023-12-16 NOTE — Therapy (Signed)
 OUTPATIENT PHYSICAL THERAPY SHOULDER EVALUATION   Patient Name: Yesenia Miller MRN: [id number] DOB:11/22/1970, 53 y.o., female Today's Date: 12/21/2023  END OF SESSION:  PT End of Session - 12/21/23 0756     Visit Number 1    Number of Visits 12    Date for PT Re-Evaluation 02/03/24    Authorization Type [place] [employer]    PT Start Time 0800    PT Stop Time 0850    PT Time Calculation (min) 50 min    Activity Tolerance Patient tolerated treatment well;Patient limited by pain    Behavior During Therapy [facility] for tasks assessed/performed             Past Medical History:  Diagnosis Date   Anxiety    CTS (carpal tunnel syndrome)    right   Past Surgical History:  Procedure Laterality Date   CARPAL TUNNEL RELEASE Right 05/27/2020   Procedure: CARPAL TUNNEL RELEASE;  Surgeon: Brunilda Capra, MD;  Location: [facility];  Service: Orthopedics;  Laterality: Right;   INCISION AND DRAINAGE BREAST ABSCESS     NECK SURGERY     c-spine   Patient Active Problem List   Diagnosis Date Noted   Abscess of breast, right, s/p I&D 12/05/2012 12/05/2012   Tobacco abuse 12/05/2012    PCP: Diamond Formica, PA   REFERRING PROVIDER: Diamond Formica, PA   REFERRING DIAG: M25.511 (ICD-10-CM) - Right shoulder pain   THERAPY DIAG:  Chronic right shoulder pain  Muscle weakness (generalized)  Abnormal posture  Rationale for Evaluation and Treatment: Rehabilitation  ONSET DATE: December 2024 after fall skiing  SUBJECTIVE:                                                                                                                                                                                      SUBJECTIVE STATEMENT: I went skiing for my birthday in December 2024 and I fell on my right shoulder and it has been hurting since then. I saw Dr Marland Silvas and took x rays he would not take me out of work and I have been working since then.  I tend to use my left hand when my  right hurts for my job.I sleep on my right arm and it hurts when I wake up in the morning. It doesn't bother me unless I lift up my arm. I have difficulty washing my back and I cannot reach behind with right arm. I have a hard time cleaning above my head at work and home   Hand dominance: Right  PERTINENT HISTORY: Carpal Tunnel on bil hands, neck surgery > 10 years ago  PAIN:  Are  you having pain? Yes: NPRS scale: 0/10 at rest  at night maybe at 6.10 Pain location: right arm over my deltoi Pain description: aching Aggravating factors: sleeping on my right side and when I lift my arm overhead,vaccuming and dusting in my job Relieving factors: resting heat and ice  PRECAUTIONS: None  RED FLAGS: None   WEIGHT BEARING RESTRICTIONS: No  FALLS:  Has patient fallen in last 6 months? Yes. 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  DIAGNOSTIC FINDINGS:  See medical records. Dr Marland Silvas took xrays with no issues noted  PATIENT SURVEYS:  Quick Dash 45.5%  COGNITION: Overall cognitive status: Within functional limits for tasks assessed     SENSATION: Pt describes tingling down arm into entire hand on Right worse at night  POSTURE: Slightly forward head and shoulders    UPPER EXTREMITY ROM:   Active ROM Right eval Left eval  Shoulder flexion 160 169  Shoulder extension 164 166  Shoulder abduction    Shoulder adduction    Shoulder internal rotation 55 70  Shoulder external rotation 93 96  Elbow flexion    Elbow extension    Wrist flexion     Wrist extension    Wrist ulnar deviation    Wrist radial deviation    Wrist pronation    Wrist supination    Key: WFL = within functional limits not formally assessed, * = concordant pain, s = stiffness/stretching sensation, NT = not tested)     UPPER EXTREMITY MMT:  MMT Right eval Left eval  Shoulder flexion 4 5  Shoulder extension 4 5  Shoulder abduction 4 5  Shoulder adduction    Shoulder internal rotation    Shoulder external rotation    Middle trapezius 4 4  Lower trapezius 4 4  Elbow flexion    Elbow extension    Wrist flexion    Wrist extension    Wrist ulnar deviation    Wrist radial deviation    Wrist pronation    Wrist supination    Grip strength (lbs)    (Blank rows = not tested, score listed is out of 5 possible points.  N = WNL, D = diminished, C = clear for gross weakness with myotome testing, * = concordant pain with testing)   SHOULDER SPECIAL TESTS: Impingement tests: Neer impingement test: negative and Painful arc test: negative   PALPATION:  TTP over deltoid and upper trap on Right, no tenderness on anterior shoulder  TREATMENT DATE: 12-21-23  Eval, posture education initial and HEP   PATIENT EDUCATION: Education details: POC Explanation of findings, posture education initial and HEP issue/review Person educated: Patient Education method: Explanation, Demonstration, Tactile cues, Verbal cues, and Handouts Education comprehension: verbalized understanding, returned demonstration, verbal cues required, and tactile cues required  HOME EXERCISE PROGRAM: Access Code: [email or web address] URL: [email or web address] Date: 12/21/2023 Prepared by: Sharlet Dawson  Exercises - Standing Isometric Shoulder Extension at Table  - 1 x daily - 7 x weekly - 3 sets - 10 reps - Shoulder External Rotation and Scapular Retraction with  Resistance  - 2 x daily - 7 x weekly - 3 sets - 10 reps - Scapular Retraction with Resistance  - 2 x daily - 7 x weekly - 3 sets - 10 reps - Shoulder Extension with Resistance - Palms Forward  - 1 x daily - 7 x weekly - 3 sets - 10 reps - Standing Shoulder Horizontal Abduction with Resistance  - 1 x daily - 7 x weekly - 3 sets - 10 reps - Standing Shoulder Diagonal Horizontal Abduction 60/120 Degrees with Resistance  - 1 x daily - 7 x weekly - 3 sets - 10 reps  ASSESSMENT:  CLINICAL IMPRESSION: Patient is a 53 y.o. female who was seen today for physical therapy evaluation and treatment for Right shoulder pain post fall skiing in December 2024. Pt works in [employer] and notes she has difficulty with tasks of vacuuming dusting and lifting heavy objects with increasing difficulty.  Pt will benefit from skilled PT to address impairments and return to full capacity in job.  She is presently continuing to work in [employer] and often uses left arm instead of Right to complete tasks.   OBJECTIVE IMPAIRMENTS: decreased activity tolerance, decreased ROM, decreased strength, impaired UE functional use, improper body mechanics, postural dysfunction, and pain.   ACTIVITY LIMITATIONS: carrying, lifting, sleeping, dressing, reach over head, and hygiene/grooming  PARTICIPATION LIMITATIONS: meal prep, cleaning, and occupation  PERSONAL FACTORS: 1 comorbidity: carpal tunnel bil  are also affecting patient's functional outcome.   REHAB POTENTIAL: Good  CLINICAL DECISION MAKING: Stable/uncomplicated  EVALUATION COMPLEXITY: Low   GOALS: Goals reviewed with patient? Yes  SHORT TERM GOALS: Target date: 01-12-24  Pt will report independence with initial HEP to improve pain management and function at home Baseline:no knowledge Goal status: INITIAL  2.  Demonstrate understanding of proper sitting posture, body mechanics, work ergonomics, and be more conscious of position and posture  throughout the day.  Baseline:  Goal status: INITIAL    LONG TERM GOALS: Target date: 02-03-24  Pt will be independent with advanced HEP Baseline: no knowledge Goal status: INITIAL  2.  Pt will demonstrate ability to clean objects on shelf overhead in order to return to fully functioning job tasks Baseline: 6/10 pain with R UE overhead Goal status: INITIAL  3.  Pt will improved Quick Dash to at least 33 % to show improved functional use of Right arm Baseline: eval 45.5% Goal status: INITIAL  4.  Pt will sleep for 4-5 hours with restorative uninterrupted sleep due to Right arm pain Baseline: Pt wakes every 2 hours due to R arm pain Goal status: INITIAL  5.  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 with pain no greater than 3/10 to complete tasks Baseline: Pt at worst pain with job tasks 6/10 and avoids using R UE Goal status: INITIAL    PLAN:  PT FREQUENCY: 1-2x/week  PT  DURATION: 6 weeks  PLANNED INTERVENTIONS: 97164- PT Re-evaluation, 97750- Physical Performance Testing, 97110-Therapeutic exercises, 97530- Therapeutic activity, [id number]- Neuromuscular re-education, 97535- Self Care, 16109- Manual therapy, [id number]- Electrical stimulation (manual), Patient/Family education, Taping, Dry Needling, Joint mobilization, Spinal mobilization, Cryotherapy, and Moist heat  PLAN FOR NEXT SESSION: HEP and TPDN if needed.  Body mechanics and posture training   Sharlet Dawson, PT, ATRIC Certified Exercise Expert for the Aging Adult  12/21/23 9:04 AM Phone: [phone] Fax: [phone]   For all possible CPT codes, reference the Planned Interventions line above.     Check all conditions that are expected to impact treatment: {Conditions expected to impact treatment:Musculoskeletal disorders   If treatment provided at initial evaluation, no treatment charged due to lack of authorization.

## 2023-12-17 ENCOUNTER — Other Ambulatory Visit: Payer: Self-pay

## 2023-12-21 ENCOUNTER — Ambulatory Visit: Attending: Orthopedic Surgery | Admitting: Physical Therapy

## 2023-12-21 ENCOUNTER — Other Ambulatory Visit: Payer: Self-pay

## 2023-12-21 ENCOUNTER — Encounter: Payer: Self-pay | Admitting: Physical Therapy

## 2023-12-21 DIAGNOSIS — M25511 Pain in right shoulder: Secondary | ICD-10-CM | POA: Diagnosis present

## 2023-12-21 DIAGNOSIS — M6281 Muscle weakness (generalized): Secondary | ICD-10-CM | POA: Diagnosis present

## 2023-12-21 DIAGNOSIS — G8929 Other chronic pain: Secondary | ICD-10-CM | POA: Insufficient documentation

## 2023-12-21 DIAGNOSIS — R293 Abnormal posture: Secondary | ICD-10-CM | POA: Diagnosis present

## 2023-12-21 NOTE — Patient Instructions (Signed)
 Posture Tips DO: - stand tall and erect - keep chin tucked in - keep head and shoulders in alignment - check posture regularly in mirror or large window - pull head back against headrest in car seat;  Change your position often.  Sit with lumbar support. DON'T: - slouch or slump while watching TV or reading - sit, stand or lie in one position  for too long;  Sitting is especially hard on the spine so if you sit at a desk/use the computer, then stand up often!   Copyright  VHI. All rights reserved.  Posture - Standing   Good posture is important. Avoid slouching and forward head thrust. Maintain curve in low back and align ears over shoul- ders, hips over ankles.  Pull your belly button in toward your back bone. Stand evenly over feet. Dont hip shift to the side as shown in clinic.  Stand with ribs lifted up and chin down  Copyright  VHI. All rights reserved.  Posture - Sitting   Sit upright, head facing forward. Try using a roll to support lower back. Keep shoulders relaxed, and avoid rounded back. Keep hips level with knees. Avoid crossing legs for long periods. Sit on sit bones  and not tail bone.  Try not to cross legs so you dont shift  Copyright  VHI. All rights reserved.   Sharlet Dawson, PT, ATRIC Certified Exercise Expert for the Aging Adult  12/21/23 8:17 AM Phone: [phone] Fax: [phone]

## 2023-12-27 ENCOUNTER — Ambulatory Visit: Admitting: Neurology

## 2023-12-27 DIAGNOSIS — R202 Paresthesia of skin: Secondary | ICD-10-CM

## 2023-12-27 DIAGNOSIS — G5603 Carpal tunnel syndrome, bilateral upper limbs: Secondary | ICD-10-CM

## 2023-12-27 NOTE — Procedures (Signed)
 [facility]  [address], [address]  [place], [place] [address] Tel: [phone] Fax: [phone] Test Date:  12/27/2023  Patient: Yesenia Miller DOB: Aug 25, 1971 Physician: Rommie Coats, MD  Sex: Female Height: 5\' 3"  Ref Phys: Marlena Sima, MD  ID#: [id number]   Technician:    History: This is a 53 year old female with numbness and tingling in her hands.  NCV & EMG Findings: Extensive electrodiagnostic evaluation of bilateral upper limbs shows: Left median sensory response shows prolonged distal peak latency (3.8 ms). Right median-ulnar palmar sensory response shows prolonged distal peak latency (Median Palm-Wrist, 2.4 ms) and abnormal peak latency difference ((Median Palm-Wrist)-(Ulnar Palm-Wrist), 0.72 ms). Right median, bilateral ulnar, and bilateral radial sensory responses are within normal limits. Bilateral median (APB) and ulnar (ADM) motor responses are within normal limits. Chronic motor axon loss changes without accompanying active denervation changes are seen in the left abductor pollicis brevis muscle. Cervical paraspinal muscles were deferred due to reported history of cervical spine surgery.  Impression: This is an abnormal study. The findings are most consistent with the following: Evidence of bilateral median mononeuropathy at or distal to the wrist, consistent with carpal tunnel syndrome. The findings are very mild in degree electrically on the right and mild in degree electrically on the left. No definitive electrodiagnostic evidence of a right or left cervical (C5-T1) motor radiculopathy. Screening studies for right or left ulnar or radial mononeuropathies are normal.    ___________________________ Rommie Coats, MD    Nerve Conduction Studies Motor Nerve Results    Latency Amplitude F-Lat Segment Distance CV Comment  Site (ms) Norm (mV) Norm (ms)  (cm) (m/s) Norm   Left Median (APB) Motor  Wrist 3.6  < 4.0 8.4  > 6.0        Elbow 7.8 -  8.2 -  Elbow-Wrist 25.5 61  > 50   Right Median (APB) Motor  Wrist 3.3  < 4.0 7.4  > 6.0        Elbow 7.8 - 7.0 -  Elbow-Wrist 25 56  > 50   Left Ulnar (ADM) Motor  Wrist 1.70  < 3.1 8.2  > 7.0        Bel elbow 4.9 - 8.1 -  Bel elbow-Wrist 19 59  > 50   Ab elbow 6.7 - 8.1 -  Ab elbow-Bel elbow 10 56 -   Right Ulnar (ADM) Motor  Wrist 1.78  < 3.1 9.3  > 7.0        Bel elbow 4.8 - 8.2 -  Bel elbow-Wrist 19 63  > 50   Ab elbow 6.6 - 7.8 -  Ab elbow-Bel elbow 10 56 -    Sensory Sites    Neg Peak Lat Amplitude (O-P) Segment Distance Velocity Comment  Site (ms) Norm (V) Norm  (cm) (ms)   Left Median Sensory  Wrist-Dig II *3.8  < 3.6 17  > 15 Wrist-Dig II 13    Right Median Sensory  Wrist-Dig II 3.4  < 3.6 23  > 15 Wrist-Dig II 13    Right Median-Ulnar Palmar Sensory       Median  Palm-Wrist *2.4  < 2.2 23  > 10 Palm-Wrist 8         Ulnar  Palm-Wrist 1.68  < 2.2 15  > 5 Palm-Wrist 8    Left Radial Sensory  Forearm-Wrist 1.85  < 2.7 14  > 14 Forearm-Wrist 10    Right Radial Sensory  Forearm-Wrist 2.1  <  2.7 18  > 14 Forearm-Wrist 10    Left Ulnar Sensory  Wrist-Dig V 2.6  < 3.1 19  > 10 Wrist-Dig V 11    Right Ulnar Sensory  Wrist-Dig V 2.8  < 3.1 17  > 10 Wrist-Dig V 11     Inter-Nerve Comparisons   Nerve 1 Value 1 Nerve 2 Value 2 Parameter Result Normal  Sensory Sites  R Median Palm-Wrist 2.4 ms R Ulnar Palm-Wrist 1.68 ms Peak Lat Diff *0.72 ms <0.40   Electromyography   Side Muscle Ins.Act Fibs Fasc Recrt Amp Dur Poly Activation Comment  Left FDI Nml Nml Nml Nml Nml Nml Nml Nml N/A  Left EIP Nml Nml Nml Nml Nml Nml Nml Nml N/A  Left Pronator teres Nml Nml Nml Nml Nml Nml Nml Nml N/A  Left APB Nml Nml Nml *1- *1+ *1+ *1+ Nml N/A  Left Biceps Nml Nml Nml Nml Nml Nml Nml Nml N/A  Left Triceps Nml Nml Nml Nml Nml Nml Nml Nml N/A  Left Deltoid Nml Nml Nml Nml Nml Nml Nml Nml N/A  Right FDI Nml Nml Nml Nml Nml Nml Nml Nml N/A  Right EIP Nml Nml Nml Nml Nml Nml Nml Nml N/A   Right Pronator teres Nml Nml Nml Nml Nml Nml Nml Nml N/A  Right APB Nml Nml Nml Nml Nml Nml Nml Nml N/A  Right Biceps Nml Nml Nml Nml Nml Nml Nml Nml N/A  Right Triceps Nml Nml Nml Nml Nml Nml Nml Nml N/A  Right Deltoid Nml Nml Nml Nml Nml Nml Nml Nml N/A      Waveforms:  Motor           Sensory

## 2023-12-28 ENCOUNTER — Other Ambulatory Visit (HOSPITAL_BASED_OUTPATIENT_CLINIC_OR_DEPARTMENT_OTHER): Payer: Self-pay

## 2023-12-28 ENCOUNTER — Ambulatory Visit: Admitting: Physical Therapy

## 2023-12-28 ENCOUNTER — Encounter: Payer: Self-pay | Admitting: Physical Therapy

## 2023-12-28 ENCOUNTER — Ambulatory Visit (AMBULATORY_SURGERY_CENTER)

## 2023-12-28 VITALS — Ht 63.0 in | Wt 145.0 lb

## 2023-12-28 DIAGNOSIS — Z860100 Personal history of colon polyps, unspecified: Secondary | ICD-10-CM

## 2023-12-28 DIAGNOSIS — M25511 Pain in right shoulder: Secondary | ICD-10-CM | POA: Diagnosis not present

## 2023-12-28 DIAGNOSIS — M6281 Muscle weakness (generalized): Secondary | ICD-10-CM

## 2023-12-28 DIAGNOSIS — R293 Abnormal posture: Secondary | ICD-10-CM

## 2023-12-28 DIAGNOSIS — G8929 Other chronic pain: Secondary | ICD-10-CM

## 2023-12-28 MED ORDER — PLENVU 140 G PO SOLR
1.0000 | Freq: Once | ORAL | 0 refills | Status: AC
  Filled 2023-12-28 – 2024-01-07 (×2): qty 3, 1d supply, fill #0

## 2023-12-28 NOTE — Therapy (Signed)
 OUTPATIENT PHYSICAL THERAPY SHOULDER TREATMENT   Patient Name: Yesenia Miller MRN: [id number] DOB:1971/05/27, 53 y.o., female Today's Date: 12/28/2023  END OF SESSION:  PT End of Session - 12/28/23 0715     Visit Number 2    Number of Visits 12    Date for PT Re-Evaluation 02/03/24    Authorization Type [facility]    Authorization Time Period 12/21/23-02/18/24    Authorization - Visit Number 2    Authorization - Number of Visits 7    PT Start Time 0715    PT Stop Time 0745    PT Time Calculation (min) 30 min             Past Medical History:  Diagnosis Date   Anxiety    CTS (carpal tunnel syndrome)    right   Past Surgical History:  Procedure Laterality Date   CARPAL TUNNEL RELEASE Right 05/27/2020   Procedure: CARPAL TUNNEL RELEASE;  Surgeon: Brunilda Capra, MD;  Location: [facility];  Service: Orthopedics;  Laterality: Right;   INCISION AND DRAINAGE BREAST ABSCESS     NECK SURGERY     c-spine   Patient Active Problem List   Diagnosis Date Noted   Abscess of breast, right, s/p I&D 12/05/2012 12/05/2012   Tobacco abuse 12/05/2012    PCP: Diamond Formica, PA   REFERRING PROVIDER: Diamond Formica, PA   REFERRING DIAG: M25.511 (ICD-10-CM) - Right shoulder pain   THERAPY DIAG:  Chronic right shoulder pain  Muscle weakness (generalized)  Abnormal posture  Rationale for Evaluation and Treatment: Rehabilitation  ONSET DATE: December 2024 after fall skiing  SUBJECTIVE:                                                                                                                                                                                      SUBJECTIVE STATEMENT: It hurts when I do the exercises.    EVAL: I went skiing for my birthday in December 2024 and I fell on my right shoulder and it has been hurting since then. I saw Dr Marland Silvas and took x rays he would not take me out of work and I have been working since then.  I tend to  use my left hand when my right hurts for my job.I sleep on my right arm and it hurts when I wake up in the morning. It doesn't bother me unless I lift up my arm. I have difficulty washing my back and I cannot reach behind with right arm. I have a hard time cleaning above my head at work and home   Hand dominance: Right  PERTINENT HISTORY: Carpal Tunnel  on bil hands, neck surgery > 10 years ago  PAIN:  Are you having pain? Yes: NPRS scale: 0/10 at rest  at night maybe at 6.10 Pain location: right arm over my deltoi Pain description: aching Aggravating factors: sleeping on my right side and when I lift my arm overhead,vaccuming and dusting in my job Relieving factors: resting heat and ice  PRECAUTIONS: None  RED FLAGS: None   WEIGHT BEARING RESTRICTIONS: No  FALLS:  Has patient fallen in last 6 months? Yes. 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  DIAGNOSTIC FINDINGS:  See medical records. Dr Marland Silvas took xrays with no issues noted  PATIENT SURVEYS:  Quick Dash 45.5%  COGNITION: Overall cognitive status: Within functional limits for tasks assessed     SENSATION: Pt describes tingling down arm into entire hand on Right worse at night  POSTURE: Slightly forward head and shoulders    UPPER EXTREMITY ROM:   Active ROM Right eval Left eval  Shoulder flexion 160 169  Shoulder extension 164 166  Shoulder abduction    Shoulder adduction    Shoulder internal rotation 55 70  Shoulder external rotation 93 96  Elbow flexion    Elbow  extension    Wrist flexion    Wrist extension    Wrist ulnar deviation    Wrist radial deviation    Wrist pronation    Wrist supination    Key: WFL = within functional limits not formally assessed, * = concordant pain, s = stiffness/stretching sensation, NT = not tested)     UPPER EXTREMITY MMT:  MMT Right eval Left eval  Shoulder flexion 4 5  Shoulder extension 4 5  Shoulder abduction 4 5  Shoulder adduction    Shoulder internal rotation    Shoulder external rotation    Middle trapezius 4 4  Lower trapezius 4 4  Elbow flexion    Elbow extension    Wrist flexion    Wrist extension    Wrist ulnar deviation    Wrist radial deviation    Wrist pronation    Wrist supination    Grip strength (lbs)    (Blank rows = not tested, score listed is out of 5 possible points.  N = WNL, D = diminished, C = clear for gross weakness with myotome testing, * = concordant pain with testing)   SHOULDER SPECIAL TESTS: Impingement tests: Neer impingement test: negative and Painful arc test: negative   PALPATION:  TTP over deltoid and upper trap on Right, no tenderness on anterior shoulder  TREATMENT DATE:  [facility] Adult PT Treatment:                                                DATE: 12/28/23 Therapeutic Exercise: Row GTB 10 x 2  Extension RTB 10 x 2  Bilat ER red x 12  Star pattern standing red band x 5 - fatigues Supine AA chest press and pullovers  Supine AA horizontal ab/aduction S/L shoulder abduction 10 x 2  S/L shoulder ER, right 10 x 2  Supine shoulder flexion long lever 2 x 10  Supine Star pattern GTB x 10 Supine GTB shoulder ER x 10   12-21-23  Eval, posture education initial and HEP   PATIENT EDUCATION: Education details: POC Explanation of findings, posture education initial and HEP issue/review Person educated: Patient Education method:  Explanation, Demonstration, Tactile cues, Verbal cues, and Handouts Education comprehension: verbalized understanding, returned demonstration, verbal cues required, and tactile cues required  HOME EXERCISE PROGRAM: Access Code: [email or web address] URL: [email or web address] Date: 12/21/2023 Prepared by: Sharlet Dawson  Exercises - Standing Isometric Shoulder Extension at Table  - 1 x daily - 7 x weekly - 3 sets - 10 reps - Shoulder External Rotation and Scapular Retraction with Resistance  - 2 x daily - 7 x weekly - 3 sets - 10 reps - Scapular Retraction with Resistance  - 2 x daily - 7 x weekly - 3 sets - 10 reps - Shoulder Extension with Resistance - Palms Forward  - 1 x daily - 7 x weekly - 3 sets - 10 reps - Standing Shoulder Horizontal Abduction with Resistance  - 1 x daily - 7 x weekly - 3 sets - 10 reps - Standing Shoulder Diagonal Horizontal Abduction 60/120 Degrees with Resistance  - 1 x daily - 7 x weekly - 3 sets - 10 reps  ASSESSMENT:  CLINICAL IMPRESSION: No pain on arrival. Pt reports compliance with HEP although the exercises cause increased pain after multiple reps. Reviewed HEP and progressed with supine / sidelying AROM and strengthening which pt was able to complete more reps with increased comfort in gravity eliminated position.    EVAL: Patient is a 53 y.o. female who was seen today for physical therapy evaluation and treatment for Right shoulder pain post fall skiing in December 2024. Pt works in [employer] and notes she has difficulty with tasks of vacuuming dusting and lifting heavy objects with increasing difficulty.  Pt will benefit from skilled PT to address impairments and return to full capacity in job.  She is presently continuing to work in [employer] and often uses left arm instead of Right to complete tasks.   OBJECTIVE IMPAIRMENTS: decreased activity tolerance, decreased ROM, decreased strength, impaired UE functional use, improper  body mechanics, postural dysfunction, and pain.   ACTIVITY LIMITATIONS: carrying, lifting, sleeping, dressing, reach over head, and hygiene/grooming  PARTICIPATION LIMITATIONS: meal prep, cleaning, and occupation  PERSONAL FACTORS: 1 comorbidity: carpal tunnel bil  are also affecting patient's functional outcome.   REHAB POTENTIAL: Good  CLINICAL DECISION MAKING: Stable/uncomplicated  EVALUATION COMPLEXITY: Low   GOALS: Goals reviewed with patient? Yes  SHORT TERM GOALS: Target date: 01-12-24  Pt will report independence with initial HEP to improve pain management and function at home Baseline:no knowledge Goal status: INITIAL  2.  Demonstrate understanding of proper sitting posture, body mechanics, work Financial controller, and  be more conscious of position and posture throughout the day.  Baseline:  Goal status: INITIAL    LONG TERM GOALS: Target date: 02-03-24  Pt will be independent with advanced HEP Baseline: no knowledge Goal status: INITIAL  2.  Pt will demonstrate ability to clean objects on shelf overhead in order to return to fully functioning job tasks Baseline: 6/10 pain with R UE overhead Goal status: INITIAL  3.  Pt will improved Quick Dash to at least 33 % to show improved functional use of Right arm Baseline: eval 45.5% Goal status: INITIAL  4.  Pt will sleep for 4-5 hours with restorative uninterrupted sleep due to Right arm pain Baseline: Pt wakes every 2 hours due to R arm pain Goal status: INITIAL  5.  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 with pain no greater than 3/10 to complete tasks Baseline: Pt at worst pain with job tasks 6/10 and avoids using R UE Goal status: INITIAL    PLAN:  PT FREQUENCY: 1-2x/week  PT DURATION: 6 weeks  PLANNED INTERVENTIONS: 97164- PT Re-evaluation, 97750- Physical Performance Testing, 97110-Therapeutic exercises, 97530- Therapeutic activity, [id number]- Neuromuscular re-education, 97535- Self Care,  97140- Manual therapy, [id number]- Electrical stimulation (manual), Patient/Family education, Taping, Dry Needling, Joint mobilization, Spinal mobilization, Cryotherapy, and Moist heat  PLAN FOR NEXT SESSION: HEP and TPDN if needed.  Body mechanics and posture training   Gasper Karst, PTA 12/28/23 8:00 AM Phone: [phone] Fax: [phone]   For all possible CPT codes, reference the Planned Interventions line above.     Check all conditions that are expected to impact treatment: {Conditions expected to impact treatment:Musculoskeletal disorders   If treatment provided at initial evaluation, no treatment charged due to lack of authorization.

## 2023-12-28 NOTE — Progress Notes (Signed)
 No egg or soy allergy known to patient  No issues known to pt with past sedation with any surgeries or procedures Patient denies ever being told they had issues or difficulty with intubation  No FH of Malignant Hyperthermia Pt is not on diet pills Pt is not on  home 02  Pt is not on blood thinners  Pt denies issues with constipation  No A fib or A flutter Have any cardiac testing pending-- no  LOA: independent  Prep: Plenvu   Patient's chart reviewed by Cathlyn Parsons CNRA prior to previsit and patient appropriate for the [facility].  Previsit completed and red dot placed by patient's name on their procedure day (on provider's schedule).     PV completed with patient. Prep instructions sent via mychart and home address.

## 2023-12-28 NOTE — Progress Notes (Addendum)
 ANNUAL EXAM Patient name: Yesenia Miller MRN [id number]  Date of birth: 1971-04-29 Chief Complaint:   No chief complaint on file.  History of Present Illness:   Yesenia Miller is a 53 y.o. G28P2002 African-American female being seen today for a routine annual exam.   Current complaints: vaginal spotting with wiping after intercourse the past two encounters with partner. Endorses vaginal dryness.   Patient's last menstrual period was 07/04/2019.   The pregnancy intention screening data noted above was reviewed. Potential methods of contraception were discussed. The patient elected to proceed with No data recorded.   Last pap 04/22/2018- NILM.  H/O abnormal pap: no Last mammogram: 04/19/23. Results were: normal. 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  Family h/o colorectal cancer: no Last menstrual period: 2015     12/15/2023    9:16 AM 06/12/2021    9:09 AM  Depression screen PHQ 2/9  Decreased Interest 1 3  Down, Depressed, Hopeless 1 3  PHQ - 2 Score 2 6  Altered sleeping 3 3  Tired, decreased energy 1 3  Change in appetite 1 3  Feeling bad or failure about yourself  1 1  Trouble concentrating 2 3  Moving slowly or fidgety/restless 0 0  Suicidal thoughts 0 0  PHQ-9 Score 10 19        12/15/2023    9:16 AM 06/12/2021    9:09 AM  GAD 7 : Generalized Anxiety Score  Nervous, Anxious, on Edge  3  Control/stop worrying 2 3  Worry too much - different things 2 3  Trouble relaxing 2 3  Restless 2 3  Easily annoyed or irritable 2 3  Afraid - awful might happen 1 1  Total GAD 7 Score  19     Review of Systems:   Pertinent items are noted in HPI Denies any headaches, blurred vision, fatigue, shortness of breath, chest pain, abdominal pain, abnormal vaginal discharge/itching/odor/irritation, problems with periods, bowel movements, urination, or intercourse  unless otherwise stated above. Pertinent History Reviewed:  Reviewed past medical,surgical, social and family history.  Reviewed problem list, medications and allergies. Physical Assessment:  There were no vitals filed for this visit.There is no height or weight on file to calculate BMI.        Physical Examination:   General appearance - well appearing, and in no distress  Mental status - alert, oriented to person, place, and time  Psych:  She has a normal mood and affect  Skin - warm and dry, normal color, no suspicious lesions noted  Chest - effort normal, all lung fields clear to auscultation bilaterally  Heart - normal rate and regular rhythm  Neck:  midline trachea, no thyromegaly or nodules  Breasts - breasts appear normal, no suspicious masses, no skin or nipple changes or  axillary nodes  Abdomen - soft, nontender, nondistended, no masses or organomegaly  Pelvic - VULVA: Loss of labial and vulvar fullness. No masses, tenderness or lesions.  VAGINA: Narrowing of the introitus noted. Pale, dry vaginal mucosa with thinning of vaginal rugae. Pinpoint hemorrhages of R vaginal wall. No lesions.  CERVIX: normal appearing cervix without discharge or lesions, no CMT  Thin prep pap is done with HR HPV cotesting  UTERUS: uterus is felt to be normal size, shape, consistency and nontender   ADNEXA: No adnexal masses or tenderness noted.  Extremities:  No swelling or varicosities noted  Chaperone present for exam  No results found for this or any previous visit (from the past 24 hours).  Assessment & Plan:  1. Encounter for annual routine gynecological examination (Primary) 2. Cervical cancer screening  3. Breast cancer screening by mammogram 4. Encounter for colorectal cancer screening - Cervical cancer screening: Discussed guidelines. Pap with HPV completed today - Breast Health: Encouraged self breast awareness/SBE. Discussed limits of clinical breast exam for detecting breast cancer.  Discussed importance of annual MXR. MXR is up to date: Mammo order placed for after 03/2024 - Climacteric/Sexual health: Reviewed typical and atypical symptoms of menopause/peri-menopause. Discussed PMB and to call if any amount of spotting.  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 - Patient encouraged to continue following with PCP - F/U 12 months and prn  - Cytology - PAP - MM 3D SCREENING MAMMOGRAM BILATERAL BREAST; Future  2. Vaginal spotting 3. Atrophic vaginitis Patient with PMB twice after intercourse. Discussed in detail the need for EMB today, risk of endometrial CA; patient elects to defer. Vaginal atrophy with petechia noted on R vaginal wall on exam. We discussed use of lubricants to ease any discomfort during intercourse and proper use of vaginal estrogen cream.  Will run swabs to r/o causes of cervicitis. We discussed that she should f/u in 6 weeks re: effectiveness of vaginal estrogen and for bx, to which she states understanding.  - Cervicovaginal ancillary only - estradiol  (ESTRACE ) 0.1 MG/GM vaginal cream; Apply 1 gram per vagina every night for 2 weeks, then apply three times a week  Dispense: 30 g; Refill: 0   Addendum Patient called 01/19/24 to follow-up about EMB. Has not picked up estrace  cream due to confusion with pharmacies. Prescribed to new pharmacy. Patient ready to schedule EMB for PMB, will have office reach out for this. Also needs repeat pap due to non-diagnostic result at annual visit.   No orders of the defined types were placed in this encounter.   Meds: No orders of the defined types were placed in this encounter.   Follow-up: No follow-ups on file.  Joeanne Robicheaux E Ayme Short, [place] 12/28/2023 10:17 AM

## 2023-12-29 ENCOUNTER — Other Ambulatory Visit (HOSPITAL_COMMUNITY)
Admission: RE | Admit: 2023-12-29 | Discharge: 2023-12-29 | Disposition: A | Discharge status: Home / Self Care | Source: Ambulatory Visit | Attending: Physician Assistant | Admitting: Physician Assistant

## 2023-12-29 ENCOUNTER — Ambulatory Visit (INDEPENDENT_AMBULATORY_CARE_PROVIDER_SITE_OTHER): Admitting: Physician Assistant

## 2023-12-29 VITALS — BP 122/74 | HR 67 | Ht 63.0 in | Wt 154.5 lb

## 2023-12-29 DIAGNOSIS — Z124 Encounter for screening for malignant neoplasm of cervix: Secondary | ICD-10-CM

## 2023-12-29 DIAGNOSIS — N952 Postmenopausal atrophic vaginitis: Secondary | ICD-10-CM

## 2023-12-29 DIAGNOSIS — Z1339 Encounter for screening examination for other mental health and behavioral disorders: Secondary | ICD-10-CM | POA: Diagnosis not present

## 2023-12-29 DIAGNOSIS — N939 Abnormal uterine and vaginal bleeding, unspecified: Secondary | ICD-10-CM

## 2023-12-29 DIAGNOSIS — Z1211 Encounter for screening for malignant neoplasm of colon: Secondary | ICD-10-CM

## 2023-12-29 DIAGNOSIS — Z01419 Encounter for gynecological examination (general) (routine) without abnormal findings: Secondary | ICD-10-CM | POA: Diagnosis not present

## 2023-12-29 DIAGNOSIS — Z1231 Encounter for screening mammogram for malignant neoplasm of breast: Secondary | ICD-10-CM | POA: Diagnosis not present

## 2023-12-29 DIAGNOSIS — Z1212 Encounter for screening for malignant neoplasm of rectum: Secondary | ICD-10-CM

## 2023-12-29 MED ORDER — ESTRADIOL 0.1 MG/GM VA CREA
2.0000 g | TOPICAL_CREAM | Freq: Every day | VAGINAL | Status: DC
Start: 2023-12-29 — End: 2023-12-29

## 2023-12-29 MED ORDER — ESTRADIOL 0.1 MG/GM VA CREA: TOPICAL_CREAM | VAGINAL | 0 refills | Status: DC

## 2023-12-30 ENCOUNTER — Ambulatory Visit: Attending: Orthopedic Surgery | Admitting: Physical Therapy

## 2023-12-30 ENCOUNTER — Encounter: Payer: Self-pay | Admitting: Physical Therapy

## 2023-12-30 DIAGNOSIS — N952 Postmenopausal atrophic vaginitis: Secondary | ICD-10-CM | POA: Insufficient documentation

## 2023-12-30 DIAGNOSIS — M25511 Pain in right shoulder: Secondary | ICD-10-CM | POA: Insufficient documentation

## 2023-12-30 DIAGNOSIS — G8929 Other chronic pain: Secondary | ICD-10-CM | POA: Insufficient documentation

## 2023-12-30 DIAGNOSIS — M6281 Muscle weakness (generalized): Secondary | ICD-10-CM | POA: Diagnosis present

## 2023-12-30 DIAGNOSIS — R293 Abnormal posture: Secondary | ICD-10-CM | POA: Insufficient documentation

## 2023-12-30 NOTE — Therapy (Signed)
 OUTPATIENT PHYSICAL THERAPY SHOULDER TREATMENT   Patient Name: Yesenia Miller MRN: [id number] DOB:[birth year], 53 y.o., female Today's Date: 12/30/2023  END OF SESSION:  PT End of Session - 12/30/23 0847     Visit Number 3    Number of Visits 12    Date for PT Re-Evaluation 02/03/24    Authorization Type [place] [employer]    Authorization Time Period 12/21/23-02/18/24    Authorization - Visit Number 3    Authorization - Number of Visits 7    PT Start Time 0846    PT Stop Time 0926    PT Time Calculation (min) 40 min             Past Medical History:  Diagnosis Date   Anxiety    CTS (carpal tunnel syndrome)    right   Past Surgical History:  Procedure Laterality Date   CARPAL TUNNEL RELEASE Right 05/27/2020   Procedure: CARPAL TUNNEL RELEASE;  Surgeon: Brunilda Capra, MD;  Location: [facility];  Service: Orthopedics;  Laterality: Right;   INCISION AND DRAINAGE BREAST ABSCESS     NECK SURGERY     c-spine   Patient Active Problem List   Diagnosis Date Noted   Abscess of breast, right, s/p I&D 12/05/2012 12/05/2012   Tobacco abuse 12/05/2012    PCP: Diamond Formica, PA   REFERRING PROVIDER: Diamond Formica, PA   REFERRING DIAG: M25.511 (ICD-10-CM) - Right shoulder pain   THERAPY DIAG:  Chronic right shoulder pain  Muscle weakness (generalized)  Abnormal posture  Rationale for Evaluation and Treatment: Rehabilitation  ONSET DATE: December 2024 after fall skiing  SUBJECTIVE:                                                                                                                                                                                      SUBJECTIVE STATEMENT: Pt reports slight improvement.  She reports HEP compliance.   EVAL: I went skiing for my birthday in December 2024 and I fell on my right shoulder and it has been hurting since then. I saw Dr Marland Silvas and took x rays he would not take me out of work and I have been working  since then.  I tend to use my left hand when my right hurts for my job.I sleep on my right arm and it hurts when I wake up in the morning. It doesn't bother me unless I lift up my arm. I have difficulty washing my back and I cannot reach behind with right arm. I have a hard time cleaning above my head at work and home   Hand dominance: Right  PERTINENT HISTORY: Carpal  Tunnel on bil hands, neck surgery > 10 years ago  PAIN:  Are you having pain? Yes: NPRS scale: 0/10 at rest  at night maybe at 6.10 Pain location: right arm over my deltoi Pain description: aching Aggravating factors: sleeping on my right side and when I lift my arm overhead,vaccuming and dusting in my job Relieving factors: resting heat and ice  PRECAUTIONS: None  RED FLAGS: None   WEIGHT BEARING RESTRICTIONS: No  FALLS:  Has patient fallen in last 6 months? Yes. 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  DIAGNOSTIC FINDINGS:  See medical records. Dr Marland Silvas took xrays with no issues noted  PATIENT SURVEYS:  Quick Dash 45.5%  COGNITION: Overall cognitive status: Within functional limits for tasks assessed     SENSATION: Pt describes tingling down arm into entire hand on Right worse at night  POSTURE: Slightly forward head and shoulders    UPPER EXTREMITY ROM:   Active ROM Right eval Left eval  Shoulder flexion 160 169  Shoulder extension 164 166  Shoulder abduction    Shoulder adduction    Shoulder internal rotation 55 70  Shoulder external rotation 93 96  Elbow  flexion    Elbow extension    Wrist flexion    Wrist extension    Wrist ulnar deviation    Wrist radial deviation    Wrist pronation    Wrist supination    Key: WFL = within functional limits not formally assessed, * = concordant pain, s = stiffness/stretching sensation, NT = not tested)     UPPER EXTREMITY MMT:  MMT Right eval Left eval  Shoulder flexion 4 5  Shoulder extension 4 5  Shoulder abduction 4 5  Shoulder adduction    Shoulder internal rotation    Shoulder external rotation    Middle trapezius 4 4  Lower trapezius 4 4  Elbow flexion    Elbow extension    Wrist flexion    Wrist extension    Wrist ulnar deviation    Wrist radial deviation    Wrist pronation    Wrist supination    Grip strength (lbs)    (Blank rows = not tested, score listed is out of 5 possible points.  N = WNL, D = diminished, C = clear for gross weakness with myotome testing, * = concordant pain with testing)   SHOULDER SPECIAL TESTS: Impingement tests: Neer impingement test: negative and Painful arc test: negative   PALPATION:  TTP over deltoid and upper trap on Right, no tenderness on anterior shoulder  TREATMENT DATE:   [facility] Adult PT Treatment:                                                DATE: 12/30/23 Therapeutic Exercise: Row GTB 12 x 2  Extension RTB 12 x 2  Bilat ER red 2x12  Cross arm stretch - 45'' x2 Towel slide on wall - 2x12 - 2# Supine AA chest press and pullovers  Supine AA horizontal ab/aduction S/L shoulder abduction 10 x 2  S/L shoulder ER, right 10 x 3  Supine shoulder flexion long lever 2 x 12 Supine Star pattern GTB x 10 Supine GTB shoulder ER 2x 10 UBE - 2.5'/2.5' L1  Manual Therapy  AP and inferior mobs GIII  [facility] Adult PT Treatment:                                                DATE: 12/28/23 Therapeutic Exercise: Row GTB 10 x  2  Extension RTB 10 x 2  Bilat ER red x 12  Star pattern standing red band x 5 - fatigues Supine AA chest press and pullovers  Supine AA horizontal ab/aduction S/L shoulder abduction 10 x 2  S/L shoulder ER, right 10 x 2  Supine shoulder flexion long lever 2 x 10  Supine Star pattern GTB x 10 Supine GTB shoulder ER x 10   12-21-23  Eval, posture education initial and HEP   PATIENT EDUCATION: Education details: POC Explanation of findings, posture education initial and HEP issue/review Person educated: Patient Education method: Explanation, Demonstration, Tactile cues, Verbal cues, and Handouts Education comprehension: verbalized understanding, returned demonstration, verbal cues required, and tactile cues required  HOME EXERCISE PROGRAM: Access Code: [email or web address] URL: [email or web address] Date: 12/30/2023 Prepared by: Lesleigh Rash  Exercises - Standing Isometric Shoulder Extension at Table  - 1 x daily - 7 x weekly - 3 sets - 10 reps - Shoulder External Rotation and Scapular Retraction with Resistance  - 2 x daily - 7 x weekly - 3 sets - 10 reps - Scapular Retraction with Resistance  - 2 x daily - 7 x weekly - 3 sets - 10 reps - Shoulder Extension with Resistance - Palms Forward  - 1 x daily - 7 x weekly - 3 sets - 10 reps - Standing Shoulder Horizontal Abduction with Resistance  - 1 x daily - 7 x weekly - 3 sets - 10 reps - Standing Shoulder Diagonal Horizontal Abduction 60/120 Degrees with Resistance  - 1 x daily - 7 x weekly - 3 sets - 10 reps - Shoulder Flexion Wall Slide with Towel  - 1 x daily - 7 x weekly - 2-3 sets - 10 reps  ASSESSMENT:  CLINICAL IMPRESSION: Finlee tolerated session well with no adverse reaction.  Shuntia is progressing as expected with lower pain and improved strength.  We were able to increase sets or reps of most exercises.  She displays near full flexion ROM in gravity reduced positions with minimal increase in pain except at end  range.  Still mainly concentrated on AA exercise at this point.  She reports improved function with being able to do her hair at home.  Towel slide added to HEP.   EVAL: Patient is  a 53 y.o. female who was seen today for physical therapy evaluation and treatment for Right shoulder pain post fall skiing in December 2024. Pt works in [employer] and notes she has difficulty with tasks of vacuuming dusting and lifting heavy objects with increasing difficulty.  Pt will benefit from skilled PT to address impairments and return to full capacity in job.  She is presently continuing to work in [employer] and often uses left arm instead of Right to complete tasks.   OBJECTIVE IMPAIRMENTS: decreased activity tolerance, decreased ROM, decreased strength, impaired UE functional use, improper body mechanics, postural dysfunction, and pain.   ACTIVITY LIMITATIONS: carrying, lifting, sleeping, dressing, reach over head, and hygiene/grooming  PARTICIPATION LIMITATIONS: meal prep, cleaning, and occupation  PERSONAL FACTORS: 1 comorbidity: carpal tunnel bil  are also affecting patient's functional outcome.   REHAB POTENTIAL: Good  CLINICAL DECISION MAKING: Stable/uncomplicated  EVALUATION COMPLEXITY: Low   GOALS: Goals reviewed with patient? Yes  SHORT TERM GOALS: Target date: 01-12-24  Pt will report independence with initial HEP to improve pain management and function at home Baseline:no knowledge Goal status: INITIAL  2.  Demonstrate understanding of proper sitting posture, body mechanics, work ergonomics, and be more conscious of position and posture throughout the day.  Baseline:  Goal status: INITIAL    LONG TERM GOALS: Target date: 02-03-24  Pt will be independent with advanced HEP Baseline: no knowledge Goal status: INITIAL  2.  Pt will demonstrate ability to clean objects on shelf overhead in order to return to fully functioning job tasks Baseline: 6/10 pain with R UE  overhead Goal status: INITIAL  3.  Pt will improved Quick Dash to at least 33 % to show improved functional use of Right arm Baseline: eval 45.5% Goal status: INITIAL  4.  Pt will sleep for 4-5 hours with restorative uninterrupted sleep due to Right arm pain Baseline: Pt wakes every 2 hours due to R arm pain Goal status: INITIAL  5.  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 with pain no greater than 3/10 to complete tasks Baseline: Pt at worst pain with job tasks 6/10 and avoids using R UE Goal status: INITIAL    PLAN:  PT FREQUENCY: 1-2x/week  PT DURATION: 6 weeks  PLANNED INTERVENTIONS: 97164- PT Re-evaluation, 97750- Physical Performance Testing, 97110-Therapeutic exercises, 97530- Therapeutic activity, [id number]- Neuromuscular re-education, 97535- Self Care, 16109- Manual therapy, [id number]- Electrical stimulation (manual), Patient/Family education, Taping, Dry Needling, Joint mobilization, Spinal mobilization, Cryotherapy, and Moist heat  PLAN FOR NEXT SESSION: HEP and TPDN if needed.  Body mechanics and posture training   Marquis Sitter PT 12/30/23 9:26 AM Phone: [phone] Fax: [phone]   For all possible CPT codes, reference the Planned Interventions line above.     Check all conditions that are expected to impact treatment: {Conditions expected to impact treatment:Musculoskeletal disorders   If treatment provided at initial evaluation, no treatment charged due to lack of authorization.

## 2023-12-31 LAB — CERVICOVAGINAL ANCILLARY ONLY
Bacterial Vaginitis (gardnerella): POSITIVE — AB
Candida Glabrata: NEGATIVE
Candida Vaginitis: NEGATIVE
Chlamydia: NEGATIVE
Comment: NEGATIVE
Comment: NEGATIVE
Comment: NEGATIVE
Comment: NEGATIVE
Comment: NEGATIVE
Comment: NORMAL
Neisseria Gonorrhea: NEGATIVE
Trichomonas: NEGATIVE

## 2024-01-02 ENCOUNTER — Encounter: Payer: Self-pay | Admitting: Physician Assistant

## 2024-01-03 ENCOUNTER — Other Ambulatory Visit (HOSPITAL_BASED_OUTPATIENT_CLINIC_OR_DEPARTMENT_OTHER): Payer: Self-pay

## 2024-01-03 ENCOUNTER — Other Ambulatory Visit: Payer: Self-pay

## 2024-01-03 MED ORDER — METRONIDAZOLE 500 MG PO TABS
500.0000 mg | ORAL_TABLET | Freq: Two times a day (BID) | ORAL | 0 refills | Status: DC
  Filled 2024-01-03 – 2024-01-09 (×2): qty 14, 7d supply, fill #0

## 2024-01-03 NOTE — Progress Notes (Signed)
 Rx flagyl sent per protocol for BV

## 2024-01-04 ENCOUNTER — Encounter: Payer: Self-pay | Admitting: Physical Therapy

## 2024-01-04 ENCOUNTER — Encounter: Admitting: Physical Therapy

## 2024-01-04 ENCOUNTER — Ambulatory Visit: Admitting: Physical Therapy

## 2024-01-04 DIAGNOSIS — M6281 Muscle weakness (generalized): Secondary | ICD-10-CM

## 2024-01-04 DIAGNOSIS — R293 Abnormal posture: Secondary | ICD-10-CM

## 2024-01-04 DIAGNOSIS — G8929 Other chronic pain: Secondary | ICD-10-CM

## 2024-01-04 DIAGNOSIS — M25511 Pain in right shoulder: Secondary | ICD-10-CM | POA: Diagnosis not present

## 2024-01-04 LAB — CYTOLOGY - PAP
Adequacy: ABNORMAL
Comment: NEGATIVE
Comment: NEGATIVE
Comment: NEGATIVE
HPV 16: NEGATIVE
HPV 18 / 45: NEGATIVE
High risk HPV: POSITIVE — AB

## 2024-01-04 NOTE — Therapy (Signed)
 OUTPATIENT PHYSICAL THERAPY SHOULDER TREATMENT   Patient Name: Yesenia Miller MRN: [id number] DOB:1970-10-27, 53 y.o., female Today's Date: 01/04/2024  END OF SESSION:  PT End of Session - 01/04/24 0719     Visit Number 4    Number of Visits 12    Date for PT Re-Evaluation 02/03/24    Authorization Type [place] [employer]    Authorization Time Period 12/21/23-02/18/24    Authorization - Visit Number 4    Authorization - Number of Visits 7    PT Start Time 0718    PT Stop Time 0756    PT Time Calculation (min) 38 min             Past Medical History:  Diagnosis Date   Anxiety    CTS (carpal tunnel syndrome)    right   Past Surgical History:  Procedure Laterality Date   CARPAL TUNNEL RELEASE Right 05/27/2020   Procedure: CARPAL TUNNEL RELEASE;  Surgeon: Brunilda Capra, MD;  Location: [facility];  Service: Orthopedics;  Laterality: Right;   INCISION AND DRAINAGE BREAST ABSCESS     NECK SURGERY     c-spine   Patient Active Problem List   Diagnosis Date Noted   Abscess of breast, right, s/p I&D 12/05/2012 12/05/2012   Tobacco abuse 12/05/2012    PCP: Diamond Formica, PA   REFERRING PROVIDER: Diamond Formica, PA   REFERRING DIAG: M25.511 (ICD-10-CM) - Right shoulder pain   THERAPY DIAG:  Chronic right shoulder pain  Muscle weakness (generalized)  Abnormal posture  Rationale for Evaluation and Treatment: Rehabilitation  ONSET DATE: December 2024 after fall skiing  SUBJECTIVE:                                                                                                                                                                                      SUBJECTIVE STATEMENT: Pt reports continued improvement. Some soreness after PT sessions. No pain on arrival.    EVAL: I went skiing for my birthday in December 2024 and I fell on my right shoulder and it has been hurting since then. I saw Dr Marland Silvas and took x rays he would not take me out of  work and I have been working since then.  I tend to use my left hand when my right hurts for my job.I sleep on my right arm and it hurts when I wake up in the morning. It doesn't bother me unless I lift up my arm. I have difficulty washing my back and I cannot reach behind with right arm. I have a hard time cleaning above my head at work and home   Hand dominance:  Right  PERTINENT HISTORY: Carpal Tunnel on bil hands, neck surgery > 10 years ago  PAIN:  Are you having pain? Yes: NPRS scale: 0/10 at rest  at night maybe at 6.10 Pain location: right arm over my deltoi Pain description: aching Aggravating factors: sleeping on my right side and when I lift my arm overhead,vaccuming and dusting in my job Relieving factors: resting heat and ice  PRECAUTIONS: None  RED FLAGS: None   WEIGHT BEARING RESTRICTIONS: No  FALLS:  Has patient fallen in last 6 months? Yes. 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  DIAGNOSTIC FINDINGS:  See medical records. Dr Marland Silvas took xrays with no issues noted  PATIENT SURVEYS:  Quick Dash 45.5%  COGNITION: Overall cognitive status: Within functional limits for tasks assessed     SENSATION: Pt describes tingling down arm into entire hand on Right worse at night  POSTURE: Slightly forward head and shoulders    UPPER EXTREMITY ROM:   Active ROM Right eval Left eval Right 01/04/24  Shoulder flexion 160 169   Shoulder extension 164 166   Shoulder abduction   140 pain  Shoulder adduction     Shoulder internal  rotation 55 70   Shoulder external rotation 93 96   Elbow flexion     Elbow extension     Wrist flexion     Wrist extension     Wrist ulnar deviation     Wrist radial deviation     Wrist pronation     Wrist supination     Key: WFL = within functional limits not formally assessed, * = concordant pain, s = stiffness/stretching sensation, NT = not tested)     UPPER EXTREMITY MMT:  MMT Right eval Left eval  Shoulder flexion 4 5  Shoulder extension 4 5  Shoulder abduction 4 5  Shoulder adduction    Shoulder internal rotation    Shoulder external rotation    Middle trapezius 4 4  Lower trapezius 4 4  Elbow flexion    Elbow extension    Wrist flexion    Wrist extension    Wrist ulnar deviation    Wrist radial deviation    Wrist pronation    Wrist supination    Grip strength (lbs)    (Blank rows = not tested, score listed is out of 5 possible points.  N = WNL, D = diminished, C = clear for gross weakness with myotome testing, * = concordant pain with testing)   SHOULDER SPECIAL TESTS: Impingement tests: Neer impingement test: negative and Painful arc test: negative   PALPATION:  TTP over deltoid and upper trap on Right, no tenderness on anterior shoulder  TREATMENT DATE:  [facility] Adult PT Treatment:                                                DATE: 01/04/24 Therapeutic Exercise: Row GTB 12 x 2  Extension RTB 12 x 2  Cross arm stretch - 45'' x2 Pec stretch in corner Towel slide on wall - 2x12 - 1 set with 2# Shoulder towel slide abduction x 12  Supine AA chest press and pullovers  Supine AA horizontal ab/aduction S/L shoulder abduction 10 x 3  S/L shoulder ER, right 10 x 2, 1#  Supine shoulder flexion long lever 3 x 10 Supine Star pattern GTB x 10 Supine GTB shoulder ER 2x 10 UBE - 2.5'/2.5' L1  Manual Therapy  AP and inferior mobs  GIII    [facility] Adult PT Treatment:                                                DATE: 12/30/23 Therapeutic Exercise: Row GTB 12 x 2  Extension RTB 12 x 2  Bilat ER red 2x12  Cross arm stretch - 45'' x2 Towel slide on wall - 2x12 - 2# Supine AA chest press and pullovers  Supine AA horizontal ab/aduction S/L shoulder abduction 10 x 2  S/L shoulder ER, right 10 x 3  Supine shoulder flexion long lever 2 x 12 Supine Star pattern GTB x 10 Supine GTB shoulder ER 2x 10 UBE - 2.5'/2.5' L1  Manual Therapy  AP and inferior mobs GIII  [facility] Adult PT Treatment:                                                DATE: 12/28/23 Therapeutic Exercise: Row GTB 10 x 2  Extension RTB 10 x 2  Bilat ER red x 12  Star pattern standing red band x 5 - fatigues Supine AA chest press and pullovers  Supine AA horizontal ab/aduction S/L shoulder abduction 10 x 2  S/L shoulder ER, right 10 x 2  Supine shoulder flexion long lever 2 x 10  Supine Star pattern GTB x 10 Supine GTB shoulder ER x 10   12-21-23  Eval, posture education initial and HEP   PATIENT EDUCATION: Education details: POC Explanation of findings, posture education initial and HEP issue/review Person educated: Patient Education method: Explanation, Demonstration, Tactile cues, Verbal cues, and Handouts Education comprehension: verbalized understanding, returned demonstration, verbal cues required, and tactile cues required  HOME EXERCISE PROGRAM: Access Code: [email or web address] URL: [email or web address] Date: 12/30/2023 Prepared by: Lesleigh Rash  Exercises - Standing Isometric Shoulder Extension at Table  - 1 x daily - 7 x weekly - 3 sets - 10 reps - Shoulder External Rotation and Scapular Retraction with Resistance  - 2 x daily - 7 x weekly - 3 sets - 10 reps - Scapular Retraction with Resistance  - 2 x daily - 7 x weekly - 3 sets - 10 reps - Shoulder Extension with Resistance - Palms Forward  - 1 x daily - 7 x weekly - 3  sets - 10 reps - Standing Shoulder Horizontal  Abduction with Resistance  - 1 x daily - 7 x weekly - 3 sets - 10 reps - Standing Shoulder Diagonal Horizontal Abduction 60/120 Degrees with Resistance  - 1 x daily - 7 x weekly - 3 sets - 10 reps - Shoulder Flexion Wall Slide with Towel  - 1 x daily - 7 x weekly - 2-3 sets - 10 reps  ASSESSMENT:  CLINICAL IMPRESSION: Myishia tolerated session well with no adverse reaction.  Esha is progressing as expected with lower pain at work and improved sleep tolerance. We were able to increase sets or reps of most exercises.  She displays near full abduction ROM in gravity reduced positions with minimal increase in pain at end range.    EVAL: Patient is a 53 y.o. female who was seen today for physical therapy evaluation and treatment for Right shoulder pain post fall skiing in December 2024. Pt works in [employer] and notes she has difficulty with tasks of vacuuming dusting and lifting heavy objects with increasing difficulty.  Pt will benefit from skilled PT to address impairments and return to full capacity in job.  She is presently continuing to work in [employer] and often uses left arm instead of Right to complete tasks.   OBJECTIVE IMPAIRMENTS: decreased activity tolerance, decreased ROM, decreased strength, impaired UE functional use, improper body mechanics, postural dysfunction, and pain.   ACTIVITY LIMITATIONS: carrying, lifting, sleeping, dressing, reach over head, and hygiene/grooming  PARTICIPATION LIMITATIONS: meal prep, cleaning, and occupation  PERSONAL FACTORS: 1 comorbidity: carpal tunnel bil  are also affecting patient's functional outcome.   REHAB POTENTIAL: Good  CLINICAL DECISION MAKING: Stable/uncomplicated  EVALUATION COMPLEXITY: Low   GOALS: Goals reviewed with patient? Yes  SHORT TERM GOALS: Target date: 01-12-24  Pt will report independence with initial HEP to improve pain management and function at  home Baseline:no knowledge Goal status: INITIAL  2.  Demonstrate understanding of proper sitting posture, body mechanics, work ergonomics, and be more conscious of position and posture throughout the day.  Baseline:  Goal status: INITIAL    LONG TERM GOALS: Target date: 02-03-24  Pt will be independent with advanced HEP Baseline: no knowledge Goal status: INITIAL  2.  Pt will demonstrate ability to clean objects on shelf overhead in order to return to fully functioning job tasks Baseline: 6/10 pain with R UE overhead Goal status: INITIAL  3.  Pt will improved Quick Dash to at least 33 % to show improved functional use of Right arm Baseline: eval 45.5% Goal status: INITIAL  4.  Pt will sleep for 4-5 hours with restorative uninterrupted sleep due to Right arm pain Baseline: Pt wakes every 2 hours due to R arm pain 01/04/24: improved Goal status: ONGOING  5.  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 with pain no greater than 3/10 to complete tasks Baseline: Pt at worst pain with job tasks 6/10 and avoids using R UE 01/04/24: improved Goal status: ONGOING    PLAN:  PT FREQUENCY: 1-2x/week  PT DURATION: 6 weeks  PLANNED INTERVENTIONS: 97164- PT Re-evaluation, 97750- Physical Performance Testing, 97110-Therapeutic exercises, 97530- Therapeutic activity, [id number]- Neuromuscular re-education, 97535- Self Care, 82956- Manual therapy, 21308- Electrical stimulation (manual), Patient/Family education, Taping, Dry Needling, Joint mobilization, Spinal mobilization, Cryotherapy, and Moist heat  PLAN FOR NEXT SESSION: HEP and TPDN if needed.  Body mechanics and posture training   Susana Enter PTA 01/04/24 7:59 AM Phone: [phone] Fax: [phone]   For all possible CPT codes,  reference the Planned Interventions line above.     Check all conditions that are expected to impact treatment: {Conditions expected to impact treatment:Musculoskeletal disorders   If  treatment provided at initial evaluation, no treatment charged due to lack of authorization.

## 2024-01-06 ENCOUNTER — Ambulatory Visit: Admitting: Physical Therapy

## 2024-01-06 ENCOUNTER — Encounter: Payer: Self-pay | Admitting: Physical Therapy

## 2024-01-06 DIAGNOSIS — M25511 Pain in right shoulder: Secondary | ICD-10-CM | POA: Diagnosis not present

## 2024-01-06 DIAGNOSIS — M6281 Muscle weakness (generalized): Secondary | ICD-10-CM

## 2024-01-06 DIAGNOSIS — R293 Abnormal posture: Secondary | ICD-10-CM

## 2024-01-06 DIAGNOSIS — G8929 Other chronic pain: Secondary | ICD-10-CM

## 2024-01-06 NOTE — Therapy (Signed)
 OUTPATIENT PHYSICAL THERAPY SHOULDER TREATMENT   Patient Name: Yesenia Miller MRN: [id number] DOB:[birth year], 53 y.o., female Today's Date: 01/06/2024  END OF SESSION:  PT End of Session - 01/06/24 0722     Visit Number 5    Number of Visits 12    Date for PT Re-Evaluation 02/03/24    Authorization Type [employer]    Authorization Time Period 12/21/23-02/18/24    Authorization - Visit Number 5    Authorization - Number of Visits 7    PT Start Time 0720    PT Stop Time 0800    PT Time Calculation (min) 40 min             Past Medical History:  Diagnosis Date   Anxiety    CTS (carpal tunnel syndrome)    right   Past Surgical History:  Procedure Laterality Date   CARPAL TUNNEL RELEASE Right 05/27/2020   Procedure: CARPAL TUNNEL RELEASE;  Surgeon: Brunilda Capra, MD;  Location:  [facility];  Service: Orthopedics;  Laterality: Right;   INCISION AND DRAINAGE BREAST ABSCESS     NECK SURGERY     c-spine   Patient Active Problem List   Diagnosis Date Noted   Abscess of breast, right, s/p I&D 12/05/2012 12/05/2012   Tobacco abuse 12/05/2012    PCP: Diamond Formica, PA   REFERRING PROVIDER: Diamond Formica, PA   REFERRING DIAG: M25.511 (ICD-10-CM) - Right shoulder pain   THERAPY DIAG:  Chronic right shoulder pain  Muscle weakness (generalized)  Abnormal posture  Rationale for Evaluation and Treatment: Rehabilitation  ONSET DATE: December 2024 after fall skiing  SUBJECTIVE:                                                                                                                                                                                      SUBJECTIVE STATEMENT: Pt reports continued improvement. Does have popping at superior shoulder with certain movements (abduction)    EVAL: I went skiing for my birthday in December 2024 and I fell on my right shoulder and it has been hurting since then. I saw Dr Marland Silvas and took x rays he would  not take me out of work and I have been working since then.  I tend to use my left hand when my right hurts for my job.I sleep on my right arm and it hurts when I wake up in the morning. It doesn't bother me unless I lift up my arm. I have difficulty washing my back and I cannot reach behind with right arm. I have a hard time cleaning above my head at work and home   Hand  dominance: Right  PERTINENT HISTORY: Carpal Tunnel on bil hands, neck surgery > 10 years ago  PAIN:  Are you having pain? Yes: NPRS scale: 0/10 at rest  at night maybe at 6.10 Pain location: right arm over my deltoi Pain description: aching Aggravating factors: sleeping on my right side and when I lift my arm overhead,vaccuming and dusting in my job Relieving factors: resting heat and ice  PRECAUTIONS: None  RED FLAGS: None   WEIGHT BEARING RESTRICTIONS: No  FALLS:  Has patient fallen in last 6 months? Yes. 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  DIAGNOSTIC FINDINGS:  See medical records. Dr Marland Silvas took xrays with no issues noted  PATIENT SURVEYS:  Quick Dash 45.5%  COGNITION: Overall cognitive status: Within functional limits for tasks assessed     SENSATION: Pt describes tingling down arm into entire hand on Right worse at night  POSTURE: Slightly forward head and shoulders    UPPER EXTREMITY ROM:   Active ROM Right eval Left eval Right 01/04/24  Shoulder flexion 160 169   Shoulder extension 164 166   Shoulder abduction   140 pain  Shoulder adduction      Shoulder internal rotation 55 70   Shoulder external rotation 93 96   Elbow flexion     Elbow extension     Wrist flexion     Wrist extension     Wrist ulnar deviation     Wrist radial deviation     Wrist pronation     Wrist supination     Key: WFL = within functional limits not formally assessed, * = concordant pain, s = stiffness/stretching sensation, NT = not tested)     UPPER EXTREMITY MMT:  MMT Right eval Left eval  Shoulder flexion 4 5  Shoulder extension 4 5  Shoulder abduction 4 5  Shoulder adduction    Shoulder internal rotation    Shoulder external rotation    Middle trapezius 4 4  Lower trapezius 4 4  Elbow flexion    Elbow extension    Wrist flexion    Wrist extension    Wrist ulnar deviation    Wrist radial deviation    Wrist pronation    Wrist supination    Grip strength (lbs)    (Blank rows = not tested, score listed is out of 5 possible points.  N = WNL, D = diminished, C = clear for gross weakness with myotome testing, * = concordant pain with testing)   SHOULDER SPECIAL TESTS: Impingement tests: Neer impingement test: negative and Painful arc test: negative   PALPATION:  TTP over deltoid and upper trap on Right, no tenderness on anterior shoulder  TREATMENT DATE:  [facility] Adult PT Treatment:                                                DATE: 01/06/24 Therapeutic Exercise: UBE - 2.5'/2.5' L 1.5 OH pulleys  Row GTB 12 x 2  Extension RTB 12 x 2  Wall Angels x 10 Pec stretch in corner Towel slide on wall - 2x12 - 1 set with 2# Shoulder towel slide abduction x 12  Bilat ER shoulder with GTB 10 x 2  Standing red band horiz x 10 Standing right red band D2 flexion 10 x 2  Supine shoulder flexion long lever 2 x 10 1# S/L shoulder abduction 10 x 2 1# S/L shoulder ER, right 10 x 2, 2#    Manual Therapy  AP and inferior  mobs GIII   [facility] Adult PT Treatment:                                                DATE: 01/04/24 Therapeutic Exercise: Row GTB 12 x 2  Extension RTB 12 x 2  Cross arm stretch - 45'' x2 Pec stretch in corner Towel slide on wall - 2x12 - 1 set with 2# Shoulder towel slide abduction x 12  Supine AA chest press and pullovers  Supine AA horizontal ab/aduction S/L shoulder abduction 10 x 3  S/L shoulder ER, right 10 x 2, 1#  Supine shoulder flexion long lever 3 x 10 Supine Star pattern GTB x 10 Supine GTB shoulder ER 2x 10 UBE - 2.5'/2.5' L1  Manual Therapy  AP and inferior mobs GIII    [facility] Adult PT Treatment:                                                DATE: 12/30/23 Therapeutic Exercise: Row GTB 12 x 2  Extension RTB 12 x 2  Bilat ER red 2x12  Cross arm stretch - 45'' x2 Towel slide on wall - 2x12 - 2# Supine AA chest press and pullovers  Supine AA horizontal ab/aduction S/L shoulder abduction 10 x 2  S/L shoulder ER, right 10 x 3  Supine shoulder flexion long lever 2 x 12 Supine Star pattern GTB x 10 Supine GTB shoulder ER 2x 10 UBE - 2.5'/2.5' L1  Manual Therapy  AP and inferior mobs GIII  [facility] Adult PT Treatment:                                                DATE: 12/28/23 Therapeutic Exercise: Row GTB 10 x 2  Extension RTB 10 x 2  Bilat ER red x 12  Star pattern standing red band x 5 - fatigues Supine AA chest press and pullovers  Supine AA horizontal ab/aduction S/L shoulder abduction 10 x 2  S/L shoulder ER, right 10 x 2  Supine shoulder flexion long lever 2 x 10  Supine Star pattern GTB x 10 Supine GTB shoulder ER x 10  12-21-23  Eval, posture education initial and HEP   PATIENT EDUCATION: Education details: POC Explanation of findings, posture education initial and HEP issue/review Person educated: Patient Education method: Explanation, Demonstration, Tactile cues, Verbal cues, and Handouts Education comprehension: verbalized understanding,  returned demonstration, verbal cues required, and tactile cues required  HOME EXERCISE PROGRAM: Access Code: [email or web address] URL: [email or web address] Date: 12/30/2023 Prepared by: Lesleigh Rash  Exercises - Standing Isometric Shoulder Extension at Table  - 1 x daily - 7 x weekly - 3 sets - 10 reps - Shoulder External Rotation and Scapular Retraction with Resistance  - 2 x daily - 7 x weekly - 3 sets - 10 reps - Scapular Retraction with Resistance  - 2 x daily - 7 x weekly - 3 sets - 10 reps - Shoulder Extension with Resistance - Palms Forward  - 1 x daily - 7 x weekly - 3 sets - 10 reps - Standing Shoulder Horizontal Abduction with Resistance  - 1 x daily - 7 x weekly - 3 sets - 10 reps - Standing Shoulder Diagonal Horizontal Abduction 60/120 Degrees with Resistance  - 1 x daily - 7 x weekly - 3 sets - 10 reps - Shoulder Flexion Wall Slide with Towel  - 1 x daily - 7 x weekly - 2-3 sets - 10 reps  ASSESSMENT:  CLINICAL IMPRESSION: Taylour tolerated session well with no adverse reaction.  Erandi is progressing as expected with lower pain at work and improved sleep tolerance. We were able to increase resistance with most therex and move into gravity resisted scapular bands.  She displays near full abduction ROM in gravity reduced positions with minimal increase in pain at end range. She does have some painless popping with abduction movements, located at anterior/superior shoulder.    EVAL: Patient is a 53 y.o. female who was seen today for physical therapy evaluation and treatment for Right shoulder pain post fall skiing in December 2024. Pt works in [employer] and notes she has difficulty with tasks of vacuuming dusting and lifting heavy objects with increasing difficulty.  Pt will benefit from skilled PT to address impairments and return to full capacity in job.  She is presently continuing to work in [employer] and often uses left arm instead of Right to complete  tasks.   OBJECTIVE IMPAIRMENTS: decreased activity tolerance, decreased ROM, decreased strength, impaired UE functional use, improper body mechanics, postural dysfunction, and pain.   ACTIVITY LIMITATIONS: carrying, lifting, sleeping, dressing, reach over head, and hygiene/grooming  PARTICIPATION LIMITATIONS: meal prep, cleaning, and occupation  PERSONAL FACTORS: 1 comorbidity: carpal tunnel bil are also affecting patient's functional outcome.   REHAB POTENTIAL: Good  CLINICAL DECISION MAKING: Stable/uncomplicated  EVALUATION COMPLEXITY: Low   GOALS: Goals reviewed with patient? Yes  SHORT TERM GOALS: Target date: 01-12-24  Pt will report independence with initial HEP to improve pain management and function at home Baseline:no knowledge Goal status: INITIAL  2.  Demonstrate understanding of proper sitting posture, body mechanics, work ergonomics, and be more conscious of position and posture throughout the day.  Baseline:  Goal status: INITIAL    LONG TERM GOALS: Target date: 02-03-24  Pt will be independent with advanced HEP Baseline: no knowledge Goal status: INITIAL  2.  Pt will demonstrate ability to clean objects on shelf overhead in order to return to fully functioning job tasks Baseline: 6/10 pain with R UE overhead Goal status: INITIAL  3.  Pt will improved Quick Dash to at least 33 % to show  improved functional use of Right arm Baseline: eval 45.5% Goal status: INITIAL  4.  Pt will sleep for 4-5 hours with restorative uninterrupted sleep due to Right arm pain Baseline: Pt wakes every 2 hours due to R arm pain 01/04/24: improved Goal status: ONGOING  5.  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 with pain no greater than 3/10 to complete tasks Baseline: Pt at worst pain with job tasks 6/10 and avoids using R UE 01/04/24: improved Goal status: ONGOING    PLAN:  PT FREQUENCY: 1-2x/week  PT DURATION: 6 weeks  PLANNED INTERVENTIONS:  97164- PT Re-evaluation, 97750- Physical Performance Testing, 97110-Therapeutic exercises, 97530- Therapeutic activity, [id number]- Neuromuscular re-education, 97535- Self Care, 69629- Manual therapy, 52841- Electrical stimulation (manual), Patient/Family education, Taping, Dry Needling, Joint mobilization, Spinal mobilization, Cryotherapy, and Moist heat  PLAN FOR NEXT SESSION: HEP and TPDN if needed.  Body mechanics and posture training   Susana Enter PTA 01/06/24 8:52 AM Phone: [phone] Fax: [phone]   For all possible CPT codes, reference the Planned Interventions line above.     Check all conditions that are expected to impact treatment: {Conditions expected to impact treatment:Musculoskeletal disorders   If treatment provided at initial evaluation, no treatment charged due to lack of authorization.

## 2024-01-07 ENCOUNTER — Other Ambulatory Visit (HOSPITAL_BASED_OUTPATIENT_CLINIC_OR_DEPARTMENT_OTHER): Payer: Self-pay

## 2024-01-07 ENCOUNTER — Other Ambulatory Visit: Payer: Self-pay

## 2024-01-10 ENCOUNTER — Other Ambulatory Visit: Payer: Self-pay

## 2024-01-11 ENCOUNTER — Encounter: Admitting: Physical Therapy

## 2024-01-11 ENCOUNTER — Ambulatory Visit (AMBULATORY_SURGERY_CENTER): Admitting: Gastroenterology

## 2024-01-11 ENCOUNTER — Encounter: Payer: Self-pay | Admitting: Gastroenterology

## 2024-01-11 ENCOUNTER — Ambulatory Visit: Admitting: Physical Therapy

## 2024-01-11 VITALS — BP 133/76 | HR 59 | Temp 97.4°F | Resp 13 | Ht 63.0 in | Wt 145.0 lb

## 2024-01-11 DIAGNOSIS — D127 Benign neoplasm of rectosigmoid junction: Secondary | ICD-10-CM | POA: Diagnosis not present

## 2024-01-11 DIAGNOSIS — K5730 Diverticulosis of large intestine without perforation or abscess without bleeding: Secondary | ICD-10-CM | POA: Diagnosis not present

## 2024-01-11 DIAGNOSIS — Z860100 Personal history of colon polyps, unspecified: Secondary | ICD-10-CM

## 2024-01-11 DIAGNOSIS — Z1211 Encounter for screening for malignant neoplasm of colon: Secondary | ICD-10-CM

## 2024-01-11 DIAGNOSIS — K641 Second degree hemorrhoids: Secondary | ICD-10-CM

## 2024-01-11 DIAGNOSIS — D124 Benign neoplasm of descending colon: Secondary | ICD-10-CM | POA: Diagnosis not present

## 2024-01-11 MED ORDER — SODIUM CHLORIDE 0.9 % IV SOLN
500.0000 mL | Freq: Once | INTRAVENOUS | Status: DC
Start: 2024-01-11 — End: 2024-01-11

## 2024-01-11 NOTE — Progress Notes (Signed)
 Called to room to assist during endoscopic procedure.  Patient ID and intended procedure confirmed with present staff. Received instructions for my participation in the procedure from the performing physician.

## 2024-01-11 NOTE — Op Note (Addendum)
  [facility] Patient Name: Yesenia Miller Procedure Date: 01/11/2024 8:28 AM MRN: [id number] Endoscopist: Yong Henle , MD, [id number] Age: 53 Referring MD:  Date of Birth: 10/18/1970 Gender: Female Account #: [id number] Procedure:                Colonoscopy Indications:              High risk colon cancer surveillance: Personal                            history of adenoma with villous component Medicines:                Monitored Anesthesia Care Procedure:                Pre-Anesthesia Assessment:                           - Prior to the procedure, a History and Physical                            was performed, and patient medications and                            allergies were reviewed. The patient's tolerance of                            previous anesthesia was also reviewed. The risks                            and benefits of the procedure and the sedation                            options and risks were discussed with the patient.                            All questions were answered, and informed consent                            was obtained. Prior Anticoagulants: The patient has                            taken no anticoagulant or antiplatelet agents                            except for NSAID medication. ASA Grade Assessment:                            II - A patient with mild systemic disease. After                            reviewing the risks and benefits, the patient was                            deemed in satisfactory condition to undergo the  procedure.                           After obtaining informed consent, the colonoscope                            was passed under direct vision. Throughout the                            procedure, the patient's blood pressure, pulse, and                            oxygen saturations were monitored continuously. The                            Olympus CF-HQ190L ([id number])  Colonoscope was                            introduced through the anus and advanced to the 3                            cm into the ileum. The colonoscopy was performed                            without difficulty. The patient tolerated the                            procedure. The quality of the bowel preparation was                            good. The terminal ileum, ileocecal valve,                            appendiceal orifice, and rectum were photographed. Scope In: 8:30:38 AM Scope Out: 8:43:32 AM Scope Withdrawal Time: 0 hours 9 minutes 32 seconds  Total Procedure Duration: 0 hours 12 minutes 54 seconds  Findings:                 The digital rectal exam findings include                            hemorrhoids. Pertinent negatives include no                            palpable rectal lesions.                           The terminal ileum and ileocecal valve appeared                            normal.                           Three sessile polyps were found in the  recto-sigmoid colon and descending colon. The                            polyps were 3 to 5 mm in size. These polyps were                            removed with a cold snare. Resection and retrieval                            were complete.                           Multiple medium-mouthed and small-mouthed                            diverticula were found in the recto-sigmoid colon,                            sigmoid colon, descending colon, splenic flexure                            and transverse colon.                           Normal mucosa was found in the entire colon                            otherwise.                           Non-bleeding non-thrombosed internal hemorrhoids                            were found during retroflexion, during perianal                            exam and during digital exam. The hemorrhoids were                            Grade II (internal hemorrhoids  that prolapse but                            reduce spontaneously). Complications:            No immediate complications. Estimated Blood Loss:     Estimated blood loss was minimal. Impression:               - Hemorrhoids found on digital rectal exam.                           - The examined portion of the ileum was normal.                           - Three 3 to 5 mm polyps at the recto-sigmoid colon  and in the descending colon, removed with a cold                            snare. Resected and retrieved.                           - Diverticulosis in the recto-sigmoid colon, in the                            sigmoid colon, in the descending colon, at the                            splenic flexure and in the transverse colon.                           - Normal mucosa in the entire examined colon                            otherwise.                           - Non-bleeding non-thrombosed internal hemorrhoids. Recommendation:           - The patient will be observed post-procedure,                            until all discharge criteria are met.                           - Discharge patient to home.                           - Patient has a contact number available for                            emergencies. The signs and symptoms of potential                            delayed complications were discussed with the                            patient. Return to normal activities tomorrow.                            Written discharge instructions were provided to the                            patient.                           - High fiber diet.                           - Use FiberCon 1-2 tablets PO daily.                           -  Continue present medications.                           - Await pathology results.                           - Repeat colonoscopy in 3-5 years for surveillance                            based on pathology results and history of  previous                            TVA.                           - The findings and recommendations were discussed                            with the patient.                           - The findings and recommendations were discussed                            with the patient's family. Yong Henle, MD 01/11/2024 8:48:00 AM

## 2024-01-11 NOTE — Patient Instructions (Addendum)
 Handouts provided on polyps, hemorrhoids, and diverticulosis.  Biopsy results and recommendations will be send via MyChart or letter.    YOU HAD AN ENDOSCOPIC PROCEDURE TODAY AT THE [facility]:   Refer to the procedure report that was given to you for any specific questions about what was found during the examination.  If the procedure report does not answer your questions, please call your gastroenterologist to clarify.  If you requested that your care partner not be given the details of your procedure findings, then the procedure report has been included in a sealed envelope for you to review at your convenience later.  YOU SHOULD EXPECT: Some feelings of bloating in the abdomen. Passage of more gas than usual.  Walking can help get rid of the air that was put into your GI tract during the procedure and reduce the bloating. If you had a lower endoscopy (such as a colonoscopy or flexible sigmoidoscopy) you may notice spotting of blood in your stool or on the toilet paper. If you underwent a bowel prep for your procedure, you may not have a normal bowel movement for a few days.  Please Note:  You might notice some irritation and congestion in your nose or some drainage.  This is from the oxygen used during your procedure.  There is no need for concern and it should clear up in a day or so.  SYMPTOMS TO REPORT IMMEDIATELY:  Following lower endoscopy (colonoscopy or flexible sigmoidoscopy):  Excessive amounts of blood in the stool  Significant tenderness or worsening of abdominal pains  Swelling of the abdomen that is new, acute  Fever of 100F or higher  For urgent or emergent issues, a gastroenterologist can be reached at any hour by calling [phone]. Do not use MyChart messaging for urgent concerns.    DIET:  We do recommend a small meal at first, but then you may proceed to your regular diet.  Drink plenty of fluids but you should avoid alcoholic beverages for 24  hours.  ACTIVITY:  You should plan to take it easy for the rest of today and you should NOT DRIVE or use heavy machinery until tomorrow (because of the sedation medicines used during the test).    FOLLOW UP: Our staff will call the number listed on your records the next business day following your procedure.  We will call around 7:15- 8:00 am to check on you and address any questions or concerns that you may have regarding the information given to you following your procedure. If we do not reach you, we will leave a message.     If any biopsies were taken you will be contacted by phone or by letter within the next 1-3 weeks.  Please call us  at [phone] if you have not heard about the biopsies in 3 weeks.    SIGNATURES/CONFIDENTIALITY: You and/or your care partner have signed paperwork which will be entered into your electronic medical record.  These signatures attest to the fact that that the information above on your After Visit Summary has been reviewed and is understood.  Full responsibility of the confidentiality of this discharge information lies with you and/or your care-partner.

## 2024-01-11 NOTE — Progress Notes (Signed)
 GASTROENTEROLOGY PROCEDURE H&P NOTE   Primary Care Physician: Charle Congo, MD  HPI: Yesenia Miller is a 53 y.o. female who presents for Colonoscopy for surveillance of prior TVA.  Past Medical History:  Diagnosis Date   Anxiety    CTS (carpal tunnel syndrome)    right   Past Surgical History:  Procedure Laterality Date   CARPAL TUNNEL RELEASE Right 05/27/2020   Procedure: CARPAL TUNNEL RELEASE;  Surgeon: Brunilda Capra, MD;  Location: [facility];  Service: Orthopedics;  Laterality: Right;   INCISION AND DRAINAGE BREAST ABSCESS     NECK SURGERY     c-spine   Current Outpatient Medications  Medication Sig Dispense Refill   atorvastatin  (LIPITOR) 20 MG tablet Take 1 tablet (20 mg total) by mouth at bedtime. 30 tablet 5   diclofenac  (VOLTAREN ) 75 MG EC tablet Take 1 tablet (75 mg total) by mouth 2 (two) times daily. 14 tablet 0   escitalopram  (LEXAPRO ) 20 MG tablet Take 1 tablet (20 mg total) by mouth daily. 30 tablet 2   fluticasone  (FLONASE  ALLERGY RELIEF) 50 MCG/ACT nasal spray Place 2 sprays into both nostrils daily. 16 g 5   pregabalin (LYRICA) 50 MG capsule Take 50 mg by mouth.     cetirizine  (ZYRTEC ) 10 MG tablet Take 1 tablet (10 mg total) by mouth every evening as needed for allergies 30 tablet 2   diclofenac  (VOLTAREN ) 75 MG EC tablet Take 1 tablet (75 mg total) by mouth 2 (two) times daily as needed with food. (Patient not taking: Reported on 12/28/2023) 60 tablet 2   escitalopram  (LEXAPRO ) 20 MG tablet Take 1 tablet (20 mg total) by mouth daily. (Patient not taking: Reported on 12/29/2023) 90 tablet 3   estradiol  (ESTRACE ) 0.1 MG/GM vaginal cream Apply 1 gram per vagina every night for 2 weeks, then apply three times a week 30 g 0   hydrOXYzine  (ATARAX ) 25 MG tablet Take 1 tablet (25 mg total) by mouth at bedtime as needed for sleep. (Patient not taking: Reported on 12/29/2023) 30 tablet 2   methocarbamol  (ROBAXIN ) 500 MG tablet Take 1 tablet (500 mg  total) by mouth 2 (two) times daily. 20 tablet 0   metroNIDAZOLE  (FLAGYL ) 500 MG tablet Take 1 tablet (500 mg total) by mouth 2 (two) times daily. (Patient not taking: Reported on 01/11/2024) 14 tablet 0   tiZANidine  (ZANAFLEX ) 4 MG tablet Take 1 tablet (4 mg total) by mouth 2 (two) times daily as needed for right shoulder pain. 30 tablet 2   Current Facility-Administered Medications  Medication Dose Route Frequency Provider Last Rate Last Admin   0.9 %  sodium chloride infusion  500 mL Intravenous Once Mansouraty, Nation Cradle Jr., MD        Current Outpatient Medications:    atorvastatin  (LIPITOR) 20 MG tablet, Take 1 tablet (20 mg total) by mouth at bedtime., Disp: 30 tablet, Rfl: 5   diclofenac  (VOLTAREN ) 75 MG EC tablet, Take 1 tablet (75 mg total) by mouth 2 (two) times daily., Disp: 14 tablet, Rfl: 0   escitalopram  (LEXAPRO ) 20 MG tablet, Take 1 tablet (20 mg total) by mouth daily., Disp: 30 tablet, Rfl: 2   fluticasone  (FLONASE  ALLERGY RELIEF) 50 MCG/ACT nasal spray, Place 2 sprays into both nostrils daily., Disp: 16 g, Rfl: 5   pregabalin (LYRICA) 50 MG capsule, Take 50 mg by mouth., Disp: , Rfl:    cetirizine  (ZYRTEC ) 10 MG tablet, Take 1 tablet (10 mg total) by mouth every evening as  needed for allergies, Disp: 30 tablet, Rfl: 2   diclofenac  (VOLTAREN ) 75 MG EC tablet, Take 1 tablet (75 mg total) by mouth 2 (two) times daily as needed with food. (Patient not taking: Reported on 12/28/2023), Disp: 60 tablet, Rfl: 2   escitalopram  (LEXAPRO ) 20 MG tablet, Take 1 tablet (20 mg total) by mouth daily. (Patient not taking: Reported on 12/29/2023), Disp: 90 tablet, Rfl: 3   estradiol  (ESTRACE ) 0.1 MG/GM vaginal cream, Apply 1 gram per vagina every night for 2 weeks, then apply three times a week, Disp: 30 g, Rfl: 0   hydrOXYzine  (ATARAX ) 25 MG tablet, Take 1 tablet (25 mg total) by mouth at bedtime as needed for sleep. (Patient not taking: Reported on 12/29/2023), Disp: 30 tablet, Rfl: 2    methocarbamol  (ROBAXIN ) 500 MG tablet, Take 1 tablet (500 mg total) by mouth 2 (two) times daily., Disp: 20 tablet, Rfl: 0   metroNIDAZOLE  (FLAGYL ) 500 MG tablet, Take 1 tablet (500 mg total) by mouth 2 (two) times daily. (Patient not taking: Reported on 01/11/2024), Disp: 14 tablet, Rfl: 0   tiZANidine  (ZANAFLEX ) 4 MG tablet, Take 1 tablet (4 mg total) by mouth 2 (two) times daily as needed for right shoulder pain., Disp: 30 tablet, Rfl: 2  Current Facility-Administered Medications:    0.9 %  sodium chloride infusion, 500 mL, Intravenous, Once, Mansouraty, Albino Alu., MD No Known Allergies Family History  Problem Relation Age of Onset   Diabetes Mother    Thyroid disease Mother    Colon cancer Neg Hx    Stomach cancer Neg Hx    Rectal cancer Neg Hx    Social History   Socioeconomic History   Marital status: Single    Spouse name: Not on file   Number of children: 2   Years of education: Not on file   Highest education level: Not on file  Occupational History   Not on file  Tobacco Use   Smoking status: Former    Current packs/day: 0.00    Types: Cigarettes    Quit date: 09/2023    Years since quitting: 0.3   Smokeless tobacco: Never  Vaping Use   Vaping status: Never Used  Substance and Sexual Activity   Alcohol use: Yes    Comment: occasional   Drug use: No   Sexual activity: Yes    Birth control/protection: Post-menopausal    Comment: no periods since 07-2019  Other Topics Concern   Not on file  Social History Narrative   Not on file   Social Drivers of Health   Financial Resource Strain: Not on file  Food Insecurity: Food Insecurity Present (04/15/2023)   Hunger Vital Sign    Worried About Running Out of Food in the Last Year: Sometimes true    Ran Out of Food in the Last Year: Sometimes true  Transportation Needs: No Transportation Needs (04/15/2023)   PRAPARE - Administrator, Civil Service (Medical): No    Lack of Transportation (Non-Medical):  No  Physical Activity: Not on file  Stress: Not on file  Social Connections: Not on file  Intimate Partner Violence: Not on file    Physical Exam: Today's Vitals   01/11/24 0728 01/11/24 0733  BP: (!) 153/72   Pulse: (!) 54   Temp: (!) 97.4 F (36.3 C) (!) 97.4 F (36.3 C)  SpO2: 99%   Weight: 145 lb (65.8 kg)   Height: 5\' 3"  (1.6 m)    Body mass index is  25.69 kg/m. GEN: NAD EYE: Sclerae anicteric ENT: MMM CV: Non-tachycardic GI: Soft, NT/ND NEURO:  Alert & Oriented x 3  Lab Results: No results for input(s): "WBC", "HGB", "HCT", "PLT" in the last 72 hours. BMET No results for input(s): "NA", "K", "CL", "CO2", "GLUCOSE", "BUN", "CREATININE", "CALCIUM " in the last 72 hours. LFT No results for input(s): "PROT", "ALBUMIN", "AST", "ALT", "ALKPHOS", "BILITOT", "BILIDIR", "IBILI" in the last 72 hours. PT/INR No results for input(s): "LABPROT", "INR" in the last 72 hours.   Impression / Plan: This is a 53 y.o.female who presents for Colonoscopy for surveillance of prior TVA.  The risks and benefits of endoscopic evaluation/treatment were discussed with the patient and/or family; these include but are not limited to the risk of perforation, infection, bleeding, missed lesions, lack of diagnosis, severe illness requiring hospitalization, as well as anesthesia and sedation related illnesses.  The patient's history has been reviewed, patient examined, no change in status, and deemed stable for procedure.  The patient and/or family is agreeable to proceed.    Yong Henle, MD [facility] Office # [phone]

## 2024-01-11 NOTE — Progress Notes (Signed)
 Vss nad trans to pacu

## 2024-01-11 NOTE — Progress Notes (Signed)
 Pt's states no medical or surgical changes since previsit or office visit.

## 2024-01-12 ENCOUNTER — Ambulatory Visit: Payer: Self-pay | Admitting: Physical Therapy

## 2024-01-12 ENCOUNTER — Telehealth: Payer: Self-pay | Admitting: Licensed Clinical Social Worker

## 2024-01-12 ENCOUNTER — Telehealth: Payer: Self-pay

## 2024-01-12 NOTE — Telephone Encounter (Signed)
 [facility] contacted patient on this date to provide [facility] intro and schedule [facility] appointment. Patient reports she's not interested at this time.

## 2024-01-12 NOTE — Telephone Encounter (Signed)
 Post procedure follow up call, no answer

## 2024-01-13 ENCOUNTER — Ambulatory Visit: Admitting: Physical Therapy

## 2024-01-13 ENCOUNTER — Encounter: Payer: Self-pay | Admitting: Physical Therapy

## 2024-01-13 DIAGNOSIS — M6281 Muscle weakness (generalized): Secondary | ICD-10-CM

## 2024-01-13 DIAGNOSIS — G8929 Other chronic pain: Secondary | ICD-10-CM

## 2024-01-13 DIAGNOSIS — N952 Postmenopausal atrophic vaginitis: Secondary | ICD-10-CM

## 2024-01-13 DIAGNOSIS — R293 Abnormal posture: Secondary | ICD-10-CM

## 2024-01-13 DIAGNOSIS — M25511 Pain in right shoulder: Secondary | ICD-10-CM | POA: Diagnosis not present

## 2024-01-13 LAB — SURGICAL PATHOLOGY

## 2024-01-13 NOTE — Therapy (Signed)
 OUTPATIENT PHYSICAL THERAPY SHOULDER TREATMENT   Patient Name: Yesenia Miller MRN: [id number] DOB:Apr 10, 1971, 53 y.o., female Today's Date: 01/13/2024  END OF SESSION:  PT End of Session - 01/13/24 0713     Visit Number 6    Number of Visits 12    Date for PT Re-Evaluation 02/03/24    Authorization Type [place] [employer]    Authorization Time Period 12/21/23-02/18/24    Authorization - Visit Number 6    Authorization - Number of Visits 7    PT Start Time 0715    PT Stop Time 0756    PT Time Calculation (min) 41 min             Past Medical History:  Diagnosis Date   Anxiety    CTS (carpal tunnel syndrome)    right   Past Surgical History:  Procedure Laterality Date   CARPAL TUNNEL RELEASE Right 05/27/2020   Procedure: CARPAL TUNNEL RELEASE;  Surgeon: Brunilda Capra, MD;  Location: [facility];  Service: Orthopedics;  Laterality: Right;   INCISION AND DRAINAGE BREAST ABSCESS     NECK SURGERY     c-spine   Patient Active Problem List   Diagnosis Date Noted   Abscess of breast, right, s/p I&D 12/05/2012 12/05/2012   Tobacco abuse 12/05/2012    PCP: Diamond Formica, PA   REFERRING PROVIDER: Diamond Formica, PA   REFERRING DIAG: M25.511 (ICD-10-CM) - Right shoulder pain   THERAPY DIAG:  Chronic right shoulder pain  Muscle weakness (generalized)  Abnormal posture  Rationale for Evaluation and Treatment: Rehabilitation  ONSET DATE: December 2024 after fall skiing  SUBJECTIVE:                                                                                                                                                                                      SUBJECTIVE STATEMENT: Pt reports continued improvement.  Pt reports that overall she is doing well and can now reach over her shoulder with minimal pain.  She continues to have some popping which is not painful.   EVAL: I went skiing for my birthday in December 2024 and I fell on my right  shoulder and it has been hurting since then. I saw Dr Marland Silvas and took x rays he would not take me out of work and I have been working since then.  I tend to use my left hand when my right hurts for my job.I sleep on my right arm and it hurts when I wake up in the morning. It doesn't bother me unless I lift up my arm. I have difficulty washing my back and I cannot reach behind  with right arm. I have a hard time cleaning above my head at work and home   Hand dominance: Right  PERTINENT HISTORY: Carpal Tunnel on bil hands, neck surgery > 10 years ago  PAIN:  Are you having pain? Yes: NPRS scale: 0/10 at rest  at night maybe at 6.10 Pain location: right arm over my deltoi Pain description: aching Aggravating factors: sleeping on my right side and when I lift my arm overhead,vaccuming and dusting in my job Relieving factors: resting heat and ice  PRECAUTIONS: None  RED FLAGS: None   WEIGHT BEARING RESTRICTIONS: No  FALLS:  Has patient fallen in last 6 months? Yes. 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  DIAGNOSTIC FINDINGS:  See medical records. Dr Marland Silvas took xrays with no issues noted  PATIENT SURVEYS:  Quick Dash 45.5%  COGNITION: Overall cognitive status: Within functional limits for tasks assessed     SENSATION: Pt describes tingling down arm into entire hand on Right worse at night  POSTURE: Slightly forward head and shoulders    UPPER EXTREMITY ROM:   Active ROM Right eval Left eval Right 01/04/24  Shoulder flexion 160 169    Shoulder extension 164 166   Shoulder abduction   140 pain  Shoulder adduction     Shoulder internal rotation 55 70   Shoulder external rotation 93 96   Elbow flexion     Elbow extension     Wrist flexion     Wrist extension     Wrist ulnar deviation     Wrist radial deviation     Wrist pronation     Wrist supination     Key: WFL = within functional limits not formally assessed, * = concordant pain, s = stiffness/stretching sensation, NT = not tested)     UPPER EXTREMITY MMT:  MMT Right eval Left eval  Shoulder flexion 4 5  Shoulder extension 4 5  Shoulder abduction 4 5  Shoulder adduction    Shoulder internal rotation    Shoulder external rotation    Middle trapezius 4 4  Lower trapezius 4 4  Elbow flexion    Elbow extension    Wrist flexion    Wrist extension    Wrist ulnar deviation    Wrist radial deviation    Wrist pronation    Wrist supination    Grip strength (lbs)    (Blank rows = not tested, score listed is out of 5 possible points.  N = WNL, D = diminished, C = clear for gross weakness with myotome testing, * = concordant pain with testing)   SHOULDER SPECIAL TESTS: Impingement tests: Neer impingement test: negative and Painful arc test: negative   PALPATION:  TTP over deltoid and upper trap on Right, no tenderness on anterior shoulder  TREATMENT DATE:  [facility] Adult PT Treatment:                                                DATE: 01/13/24 Therapeutic Exercise: UBE - 2.5'/2.5' L 1.5 OH pulleys  Row Blue TB 12 x 2  Extension GTB 12 x 2  Wall Angels x 15 Pec stretch in corner Standing red band horiz 2x10 Standing right red band D2 flexion 10 x 2 - stopped d/t pain Supine shoulder flexion long lever 2 x 10 2# Shoulder chest press in supine - 2# - 2x10 SA circles in supine - 2# - 2x10 ea S/L shoulder abduction 10 x 2 2# S/L  shoulder ER, right 10 x 3, 2#  Lateral wall walk with YTB - 2 laps  Therapeutic Activity  Towel slide on wall - 10x 2#, 2x10 with lift off  Shoulder towel slide abduction 2x10  Seated fwd OH press - 2x10      PATIENT EDUCATION: Education details: POC Explanation of findings, posture education initial and HEP issue/review Person educated: Patient Education method: Explanation, Demonstration, Tactile cues, Verbal cues, and Handouts Education comprehension: verbalized understanding, returned demonstration, verbal cues required, and tactile cues required  HOME EXERCISE PROGRAM: Access Code: [email or web address] URL: [email or web address] Date: 12/30/2023 Prepared by: Lesleigh Rash  Exercises - Standing Isometric Shoulder Extension at Table  - 1 x daily - 7 x weekly - 3 sets - 10 reps - Shoulder External Rotation and Scapular Retraction with Resistance  - 2 x daily - 7 x weekly - 3 sets - 10 reps - Scapular Retraction with Resistance  - 2 x daily - 7 x weekly - 3 sets - 10 reps - Shoulder Extension with Resistance - Palms Forward  - 1 x daily - 7 x weekly - 3 sets - 10 reps - Standing Shoulder Horizontal Abduction with Resistance  - 1 x daily - 7 x weekly - 3 sets - 10 reps - Standing Shoulder Diagonal Horizontal Abduction 60/120 Degrees with Resistance  - 1 x daily - 7 x weekly - 3 sets - 10 reps - Shoulder Flexion Wall Slide with Towel  - 1 x daily - 7 x weekly - 2-3 sets - 10 reps  ASSESSMENT:  CLINICAL IMPRESSION: Edlyn tolerated session well with no adverse reaction.  Progressing as expected with lower pain and improved ability to reach [facility].  We will plan to D/C next visit barring any significant change in status.   EVAL: Patient is a 53 y.o. female who was seen today for physical therapy evaluation and treatment for Right shoulder pain post fall skiing in December 2024. Pt works in [employer] and notes she has difficulty with tasks of vacuuming dusting and  lifting heavy objects with increasing difficulty.  Pt will benefit from skilled PT to address impairments and return to full capacity in job.  She is presently continuing to work in [employer] and often uses left arm instead of Right to complete tasks.   OBJECTIVE IMPAIRMENTS: decreased activity tolerance, decreased ROM, decreased strength, impaired UE functional use, improper body mechanics, postural dysfunction, and pain.   ACTIVITY LIMITATIONS: carrying, lifting, sleeping, dressing, reach over head, and hygiene/grooming  PARTICIPATION LIMITATIONS: meal prep, cleaning, and occupation  PERSONAL FACTORS: 1 comorbidity: carpal tunnel bil are also affecting patient's functional outcome.   REHAB POTENTIAL: Good  CLINICAL DECISION  MAKING: Stable/uncomplicated  EVALUATION COMPLEXITY: Low   GOALS: Goals reviewed with patient? Yes  SHORT TERM GOALS: Target date: 01-12-24  Pt will report independence with initial HEP to improve pain management and function at home Baseline:no knowledge Goal status: MET  2.  Demonstrate understanding of proper sitting posture, body mechanics, work ergonomics, and be more conscious of position and posture throughout the day.  Baseline:  Goal status: MET    LONG TERM GOALS: Target date: 02-03-24  Pt will be independent with advanced HEP Baseline: no knowledge Goal status: INITIAL  2.  Pt will demonstrate ability to clean objects on shelf overhead in order to return to fully functioning job tasks Baseline: 6/10 pain with R UE overhead 5/15: MET Goal status: MET  3.  Pt will improved Quick Dash to at least 33 % to show improved functional use of Right arm Baseline: eval 45.5% Goal status: INITIAL  4.  Pt will sleep for 4-5 hours with restorative uninterrupted sleep due to Right arm pain Baseline: Pt wakes every 2 hours due to R arm pain 01/04/24: improved Goal status: ONGOING  5.  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 with pain no greater than 3/10 to complete tasks Baseline: Pt at worst pain with job tasks 6/10 and avoids using R UE 01/04/24: improved Goal status: ONGOING    PLAN:  PT FREQUENCY: 1-2x/week  PT DURATION: 6 weeks  PLANNED INTERVENTIONS: 97164- PT Re-evaluation, 97750- Physical Performance Testing, 97110-Therapeutic exercises, 97530- Therapeutic activity, [id number]- Neuromuscular re-education, 97535- Self Care, 95188- Manual therapy, 41660- Electrical stimulation (manual), Patient/Family education, Taping, Dry Needling, Joint mobilization, Spinal mobilization, Cryotherapy, and Moist heat  PLAN FOR NEXT SESSION: HEP and TPDN if needed.  Body mechanics and posture training   Marquis Sitter PT 01/13/24 8:00 AM Phone: [phone] Fax: [phone]   For all possible CPT codes, reference the Planned Interventions line above.     Check all conditions that are expected to impact treatment: {Conditions expected to impact treatment:Musculoskeletal disorders   If treatment provided at initial evaluation, no treatment charged due to lack of authorization.

## 2024-01-16 ENCOUNTER — Ambulatory Visit: Payer: Self-pay | Admitting: Gastroenterology

## 2024-01-18 ENCOUNTER — Ambulatory Visit: Admitting: Physical Therapy

## 2024-01-18 ENCOUNTER — Other Ambulatory Visit: Payer: Self-pay

## 2024-01-18 ENCOUNTER — Encounter: Admitting: Physical Therapy

## 2024-01-19 ENCOUNTER — Other Ambulatory Visit: Payer: Self-pay | Admitting: Physician Assistant

## 2024-01-19 DIAGNOSIS — N952 Postmenopausal atrophic vaginitis: Secondary | ICD-10-CM

## 2024-01-19 DIAGNOSIS — N950 Postmenopausal bleeding: Secondary | ICD-10-CM | POA: Insufficient documentation

## 2024-01-19 MED ORDER — ESTRADIOL 0.1 MG/GM VA CREA: TOPICAL_CREAM | VAGINAL | 0 refills | Status: DC

## 2024-01-20 ENCOUNTER — Other Ambulatory Visit: Payer: Self-pay

## 2024-01-20 ENCOUNTER — Ambulatory Visit: Admitting: Physical Therapy

## 2024-01-20 ENCOUNTER — Encounter: Payer: Self-pay | Admitting: Physical Therapy

## 2024-01-20 DIAGNOSIS — M25511 Pain in right shoulder: Secondary | ICD-10-CM | POA: Diagnosis not present

## 2024-01-20 DIAGNOSIS — M6281 Muscle weakness (generalized): Secondary | ICD-10-CM

## 2024-01-20 DIAGNOSIS — R293 Abnormal posture: Secondary | ICD-10-CM

## 2024-01-20 DIAGNOSIS — G8929 Other chronic pain: Secondary | ICD-10-CM

## 2024-01-20 NOTE — Therapy (Signed)
 PHYSICAL THERAPY DISCHARGE SUMMARY  Visits from Start of Care: 7  Current functional level related to goals / functional outcomes: See assessment/goals   Remaining deficits: See assessment/goals   Education / Equipment: HEP and D/C plans  Patient agrees to discharge. Patient goals were met. Patient is being discharged due to being pleased with the current functional level.   Patient Name: Yesenia Miller MRN: [id number] DOB:May 24, 1971, 53 y.o., female Today's Date: 01/20/2024  END OF SESSION:  PT End of Session - 01/20/24 0716     Visit Number 7    Number of Visits 12    Date for PT Re-Evaluation 02/03/24    Authorization Type [facility] [employer]    Authorization Time Period 12/21/23-02/18/24    Authorization - Visit Number 7    Authorization - Number of Visits 7    PT Start Time 0715    PT Stop Time 0756    PT Time Calculation (min) 41 min             Past Medical History:  Diagnosis Date   Anxiety    CTS (carpal tunnel syndrome)    right   Past Surgical History:  Procedure Laterality Date   CARPAL TUNNEL RELEASE Right 05/27/2020   Procedure: CARPAL TUNNEL RELEASE;  Surgeon: Brunilda Capra, MD;  Location: [facility];  Service: Orthopedics;  Laterality: Right;   INCISION AND DRAINAGE BREAST ABSCESS     NECK SURGERY     c-spine   Patient Active Problem List   Diagnosis Date Noted   Postmenopausal postcoital bleeding 01/19/2024   Abscess of breast, right, s/p I&D 12/05/2012 12/05/2012   Tobacco abuse 12/05/2012    PCP: Diamond Formica, PA   REFERRING PROVIDER: Diamond Formica, PA   REFERRING DIAG: M25.511 (ICD-10-CM) - Right shoulder pain   THERAPY DIAG:  Chronic right shoulder pain  Muscle weakness (generalized)  Abnormal posture  Rationale for Evaluation and Treatment: Rehabilitation  ONSET DATE: December 2024 after fall skiing  SUBJECTIVE:                                                                                                                                                                                       SUBJECTIVE STATEMENT: Pt reports continued improvement and feels that she is ready to d/c today.   EVAL: I went skiing for my birthday in December 2024 and I fell on my right shoulder and it has been hurting since then. I saw Dr Marland Silvas and took x rays he would not take me out of work and I have been working since then.  I tend to use my left hand when my right  hurts for my job.I sleep on my right arm and it hurts when I wake up in the morning. It doesn't bother me unless I lift up my arm. I have difficulty washing my back and I cannot reach behind with right arm. I have a hard time cleaning above my head at work and home   Hand dominance: Right  PERTINENT HISTORY: Carpal Tunnel on bil hands, neck surgery > 10 years ago  PAIN:  Are you having pain? Yes: NPRS scale: 0/10 at rest  at night maybe at 6.10 Pain location: right arm over my deltoi Pain description: aching Aggravating factors: sleeping on my right side and when I lift my arm overhead,vaccuming and dusting in my job Relieving factors: resting heat and ice  PRECAUTIONS: None  RED FLAGS: None   WEIGHT BEARING RESTRICTIONS: No  FALLS:  Has patient fallen in last 6 months? Yes. 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  DIAGNOSTIC FINDINGS:  See medical records. Dr Marland Silvas took xrays with no issues noted  PATIENT SURVEYS:  Quick Dash 45.5%  COGNITION: Overall cognitive status: Within functional  limits for tasks assessed     SENSATION: Pt describes tingling down arm into entire hand on Right worse at night  POSTURE: Slightly forward head and shoulders    UPPER EXTREMITY ROM:   Active ROM Right eval Left eval Right 01/04/24  Shoulder flexion 160 169   Shoulder extension 164 166   Shoulder abduction   140 pain  Shoulder adduction     Shoulder internal rotation 55 70   Shoulder external rotation 93 96   Elbow flexion     Elbow extension     Wrist flexion     Wrist extension     Wrist ulnar deviation     Wrist radial deviation     Wrist pronation     Wrist supination     Key: WFL = within functional limits not formally assessed, * = concordant pain, s = stiffness/stretching sensation, NT = not tested)     UPPER EXTREMITY MMT:  MMT Right eval Left eval  Shoulder flexion 4 5  Shoulder extension 4 5  Shoulder abduction 4 5  Shoulder adduction    Shoulder internal rotation    Shoulder external rotation    Middle trapezius 4 4  Lower trapezius 4 4  Elbow flexion    Elbow extension    Wrist flexion    Wrist extension    Wrist ulnar deviation    Wrist radial deviation    Wrist pronation    Wrist supination    Grip strength (lbs)    (Blank rows = not tested, score listed is out of 5 possible points.  N = WNL, D = diminished, C = clear for gross weakness with myotome testing, * = concordant pain with testing)   SHOULDER SPECIAL TESTS: Impingement tests: Neer impingement test: negative and Painful arc test: negative   PALPATION:  TTP over deltoid and upper trap on Right, no tenderness on anterior shoulder  TREATMENT DATE:  [facility] Adult PT Treatment:                                                DATE: 01/20/24 Therapeutic Exercise: UBE - 2.5'/2.5' L 1.5 OH pulleys  Row Blue TB 15 x 2  Extension blue TB 15 x 2  Wall Angels x 15 Pec  stretch in corner Standing red band horiz 2x10 Supine shoulder flexion long lever 3 x 10 3# Shoulder chest press in supine - 3# - 2x10 SA circles in supine - 3# - 2x10 ea S/L shoulder abduction 10 x 3 3# S/L shoulder ER, right 10 x 3, 3#  Lateral wall walk with YTB - 2 laps  Therapeutic Activity  Seated fwd OH press - 2x10      PATIENT EDUCATION: Education details: POC Explanation of findings, posture education initial and HEP issue/review Person educated: Patient Education method: Explanation, Demonstration, Tactile cues, Verbal cues, and Handouts Education comprehension: verbalized understanding, returned demonstration, verbal cues required, and tactile cues required  HOME EXERCISE PROGRAM: Access Code: [email or web address] URL: [email or web address] Date: 01/20/2024 Prepared by: Lesleigh Rash  Exercises - Shoulder External Rotation and Scapular Retraction with Resistance  - 2 x daily - 7 x weekly - 3 sets - 10 reps - Standing Shoulder Horizontal Abduction with Resistance  - 1 x daily - 7 x weekly - 3 sets - 10 reps - Shoulder Flexion Wall Slide with Towel  - 1 x daily - 7 x weekly - 2-3 sets - 10 reps - Shoulder Overhead Press in Flexion with Dumbbells  - 1 x daily - 7 x weekly - 3 sets - 10 reps - Forearm Walks on Wall with Resistance Band  - 1 x daily - 7 x weekly - 3 sets - 10 reps - Doorway Pec Stretch at 90 Degrees Abduction  - 1 x daily - 7 x weekly - 3 reps - 45 second hold - Sidelying Shoulder External Rotation  - 1 x daily - 4-7 x weekly - 3 sets - 15 reps - Sidelying Shoulder Abduction Palm Forward  - 1 x daily - 7 x weekly - 3 sets - 10 reps  ASSESSMENT:  CLINICAL IMPRESSION: Laurann tolerated session well with no adverse reaction.  She has met all short and long term goals, is pleased with her current function, and feel confident in self management with HEP.  Will D/C with HEP.   EVAL: Patient is a 53 y.o. female who was seen today for physical therapy  evaluation and treatment for Right shoulder pain post fall skiing in December 2024. Pt works in [employer] and notes she has difficulty with tasks of vacuuming dusting and lifting heavy objects with increasing difficulty.  Pt will benefit from skilled PT to address impairments and return to full capacity in job.  She is presently continuing to work in [employer] and often uses left arm instead of Right to complete tasks.   OBJECTIVE IMPAIRMENTS: decreased activity tolerance, decreased ROM, decreased strength, impaired UE functional use, improper body mechanics, postural dysfunction, and pain.   ACTIVITY LIMITATIONS: carrying, lifting, sleeping, dressing, reach over head, and hygiene/grooming  PARTICIPATION LIMITATIONS: meal prep, cleaning, and occupation  PERSONAL FACTORS: 1 comorbidity: carpal tunnel bil are also affecting patient's functional outcome.   REHAB POTENTIAL: Good  CLINICAL DECISION MAKING: Stable/uncomplicated  EVALUATION COMPLEXITY: Low  GOALS: Goals reviewed with patient? Yes  SHORT TERM GOALS: Target date: 01-12-24  Pt will report independence with initial HEP to improve pain management and function at home Baseline:no knowledge Goal status: MET  2.  Demonstrate understanding of proper sitting posture, body mechanics, work ergonomics, and be more conscious of position and posture throughout the day.  Baseline:  Goal status: MET    LONG TERM GOALS: Target date: 02-03-24  Pt will be independent with advanced HEP Baseline: no knowledge Goal status: MET  2.  Pt will demonstrate ability to clean objects on shelf overhead in order to return to fully functioning job tasks Baseline: 6/10 pain with R UE overhead 5/15: MET Goal status: MET  3.  Pt will improved Quick Dash to at least 33 % to show improved functional use of Right arm Baseline: eval 45.5% 5/22: 13.6 % Goal status: MET  4.  Pt will sleep for 4-5 hours with restorative uninterrupted  sleep due to Right arm pain Baseline: Pt wakes every 2 hours due to R arm pain 01/04/24: improved 5/22: MET Goal status: MET  5.  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 with pain no greater than 3/10 to complete tasks Baseline: Pt at worst pain with job tasks 6/10 and avoids using R UE 01/04/24: improved 5/22: MET Goal status: MET    PLAN:  PT FREQUENCY: 1-2x/week  PT DURATION: 6 weeks  PLANNED INTERVENTIONS: 97164- PT Re-evaluation, 97750- Physical Performance Testing, 97110-Therapeutic exercises, 97530- Therapeutic activity, 97112- Neuromuscular re-education, 97535- Self Care, 95621- Manual therapy, 30865- Electrical stimulation (manual), Patient/Family education, Taping, Dry Needling, Joint mobilization, Spinal mobilization, Cryotherapy, and Moist heat  PLAN FOR NEXT SESSION: HEP and TPDN if needed.  Body mechanics and posture training   Marquis Sitter PT 01/20/24 7:58 AM Phone: [phone] Fax: [phone]   For all possible CPT codes, reference the Planned Interventions line above.     Check all conditions that are expected to impact treatment: {Conditions expected to impact treatment:Musculoskeletal disorders   If treatment provided at initial evaluation, no treatment charged due to lack of authorization.

## 2024-02-01 ENCOUNTER — Ambulatory Visit: Admitting: Advanced Practice Midwife

## 2024-02-01 ENCOUNTER — Other Ambulatory Visit (HOSPITAL_BASED_OUTPATIENT_CLINIC_OR_DEPARTMENT_OTHER): Payer: Self-pay

## 2024-02-01 ENCOUNTER — Other Ambulatory Visit (HOSPITAL_COMMUNITY)
Admission: RE | Admit: 2024-02-01 | Discharge: 2024-02-01 | Disposition: A | Discharge status: Home / Self Care | Source: Ambulatory Visit | Attending: Advanced Practice Midwife | Admitting: Advanced Practice Midwife

## 2024-02-01 ENCOUNTER — Encounter: Payer: Self-pay | Admitting: Advanced Practice Midwife

## 2024-02-01 VITALS — BP 127/77 | HR 55 | Ht 62.0 in | Wt 155.0 lb

## 2024-02-01 DIAGNOSIS — N950 Postmenopausal bleeding: Secondary | ICD-10-CM

## 2024-02-01 DIAGNOSIS — N952 Postmenopausal atrophic vaginitis: Secondary | ICD-10-CM | POA: Diagnosis not present

## 2024-02-01 DIAGNOSIS — R87615 Unsatisfactory cytologic smear of cervix: Secondary | ICD-10-CM

## 2024-02-01 DIAGNOSIS — N930 Postcoital and contact bleeding: Secondary | ICD-10-CM | POA: Diagnosis not present

## 2024-02-01 MED ORDER — PREMARIN 0.625 MG/GM VA CREA
TOPICAL_CREAM | VAGINAL | 12 refills | Status: AC
  Filled 2024-02-01: qty 30, 30d supply, fill #0
  Filled 2024-05-24: qty 30, 30d supply, fill #1

## 2024-02-01 NOTE — Progress Notes (Signed)
   GYNECOLOGY PROGRESS NOTE  History:  53 y.o. G2P2002 presents to [facility] office today for a repeat Pap. Results from 12/29/23 with HPV positive but insufficient cells for cytology.  Pt also reports she has not used Estrace  cream as prescribed because her pharmacy does not have it in stock. She is still having light bleeding after intercourse and occasionally at other times.  She denies any pain.  No hx of abnormal Pap.  The following portions of the patient's history were reviewed and updated as appropriate: allergies, current medications, past family history, past medical history, past social history, past surgical history and problem list.   Health Maintenance Due  Topic Date Due   HIV Screening  Never done   Hepatitis C Screening  Never done   DTaP/Tdap/Td (1 - Tdap) Never done   Zoster Vaccines- Shingrix (1 of 2) Never done   COVID-19 Vaccine (1 - 2024-25 season) Never done     Review of Systems:  Pertinent items are noted in HPI.   Objective:  Physical Exam Blood pressure 127/77, pulse (!) 55, height 5\' 2"  (1.575 m), weight 155 lb (70.3 kg), last menstrual period 07/04/2019. VS reviewed, nursing note reviewed,  Constitutional: well developed, well nourished, no distress HEENT: normocephalic CV: normal rate Pulm/chest wall: normal effort Breast Exam: deferred Abdomen: soft Neuro: alert and oriented x 3 Skin: warm, dry Psych: affect normal Pelvic exam: Cervix pink, visually closed, without lesion, scant white creamy discharge, vaginal walls and external genitalia normal Bimanual exam: Cervix 0/long/high, firm, anterior, neg CMT, uterus nontender, nonenlarged, adnexa without tenderness, enlargement, or mass  Assessment & Plan:  1. Postmenopausal bleeding (Primary) --Consult with Dr Alto Atta. Plan for Pap today, outpatient US , then f/u with provider for possible endometrial biopsy.   - US  PELVIC COMPLETE WITH TRANSVAGINAL; Future  2. Postmenopausal postcoital bleeding  -  US  PELVIC COMPLETE WITH TRANSVAGINAL; Future - conjugated estrogens (PREMARIN) vaginal cream; Use 0.5 g daily for 2 weeks, then twice per week.  You can adjust dose up from 0.5 mg to 2 g (full applicator) as needed.  Dispense: 42.5 g; Refill: 12  3. Encounter for repeat Pap smear due to previous insuff cervical cells  - Cytology - PAP( [facility])  4. Atrophic vaginitis --Pt pharmacy has been unable to fill Estrace . Switch to Premarin.    - conjugated estrogens (PREMARIN) vaginal cream; Use 0.5 g daily for 2 weeks, then twice per week.  You can adjust dose up from 0.5 mg to 2 g (full applicator) as needed.  Dispense: 42.5 g; Refill: 12   No follow-ups on file.   Arlester Bence, CNM 12:04 PM

## 2024-02-01 NOTE — Progress Notes (Signed)
 Pt presents for repeat PAP.  Estradiol  was out of stock at pharmacy

## 2024-02-03 ENCOUNTER — Other Ambulatory Visit: Payer: Self-pay

## 2024-02-04 LAB — CYTOLOGY - PAP
Comment: NEGATIVE
Comment: NEGATIVE
Comment: NEGATIVE
Diagnosis: UNDETERMINED — AB
HPV 16: NEGATIVE
HPV 18 / 45: NEGATIVE
High risk HPV: POSITIVE — AB

## 2024-02-07 ENCOUNTER — Ambulatory Visit (HOSPITAL_COMMUNITY)
Admission: RE | Admit: 2024-02-07 | Discharge: 2024-02-07 | Disposition: A | Discharge status: Home / Self Care | Source: Ambulatory Visit | Attending: Advanced Practice Midwife | Admitting: Advanced Practice Midwife

## 2024-02-07 DIAGNOSIS — N950 Postmenopausal bleeding: Secondary | ICD-10-CM | POA: Insufficient documentation

## 2024-02-07 DIAGNOSIS — N930 Postcoital and contact bleeding: Secondary | ICD-10-CM | POA: Insufficient documentation

## 2024-02-08 ENCOUNTER — Ambulatory Visit: Payer: Self-pay | Admitting: Advanced Practice Midwife

## 2024-02-17 ENCOUNTER — Other Ambulatory Visit: Payer: Self-pay

## 2024-02-18 ENCOUNTER — Other Ambulatory Visit: Payer: Self-pay

## 2024-02-18 MED ORDER — CETIRIZINE HCL 10 MG PO TABS
10.0000 mg | ORAL_TABLET | Freq: Every evening | ORAL | 2 refills | Status: AC
  Filled 2024-02-18: qty 30, 30d supply, fill #0
  Filled 2024-03-20: qty 30, 30d supply, fill #1
  Filled 2024-05-24: qty 30, 30d supply, fill #2

## 2024-02-29 ENCOUNTER — Other Ambulatory Visit (HOSPITAL_COMMUNITY)
Admission: RE | Admit: 2024-02-29 | Discharge: 2024-02-29 | Disposition: A | Discharge status: Home / Self Care | Source: Ambulatory Visit | Attending: Obstetrics and Gynecology | Admitting: Obstetrics and Gynecology

## 2024-02-29 ENCOUNTER — Ambulatory Visit: Admitting: Obstetrics and Gynecology

## 2024-02-29 VITALS — BP 132/79 | HR 61 | Wt 155.0 lb

## 2024-02-29 DIAGNOSIS — Z3202 Encounter for pregnancy test, result negative: Secondary | ICD-10-CM | POA: Diagnosis not present

## 2024-02-29 DIAGNOSIS — R87810 Cervical high risk human papillomavirus (HPV) DNA test positive: Secondary | ICD-10-CM | POA: Insufficient documentation

## 2024-02-29 DIAGNOSIS — R87610 Atypical squamous cells of undetermined significance on cytologic smear of cervix (ASC-US): Secondary | ICD-10-CM

## 2024-02-29 NOTE — Progress Notes (Unsigned)
 Pt presents for colpo. Pt has no questions or concerns at this time.

## 2024-02-29 NOTE — Progress Notes (Unsigned)
 Colposcopy Procedure Note  Yesenia Miller is a 53 y.o. [id number] here for colposcopy.  Indications:  ASCUs, positive high risk HPV  Procedure Details  LMP 2020; UPT negative.    The risks (including infection, bleeding, pain) and benefits of the procedure were explained to the patient and written informed consent was obtained.  The patient was placed in the dorsal lithotomy position. A Graves was speculum inserted in the vagina, and the cervix was visualized.  The cervix was stained with acetic acid and visualized using the colposcope under magnification as well as with a green filter. Findings as below. Cervical biopsies were taken at 9 and 12 o'clock. Endocervical curettage then performed in all four quadrants. Small amount of bleeding noted that improved with pressure. Patient tolerating procedure well.  Findings: acetowhite scant at 9 o'clock, otherwise no lesions  Impression: benign  Adequate: no  Specimens:  Cervical biopsy at 9 and 12 o'clock Endocervical curettage  Condition: Stable  Complications: None  Plan: The patient was advised to call for any fever or for prolonged or severe pain or bleeding. She was advised to use OTC analgesics as needed for mild to moderate pain. She was advised to avoid vaginal intercourse for 48 hours or until the bleeding has completely stopped.  Will base further management on results of biopsy.   LOIS Yolanda Moats, MD, [facility] Attending Center for [employer] [facility])

## 2024-03-01 LAB — POCT URINE PREGNANCY: Preg Test, Ur: NEGATIVE

## 2024-03-02 ENCOUNTER — Ambulatory Visit: Payer: Self-pay | Admitting: Obstetrics and Gynecology

## 2024-03-02 LAB — SURGICAL PATHOLOGY

## 2024-03-20 ENCOUNTER — Other Ambulatory Visit: Payer: Self-pay

## 2024-03-21 ENCOUNTER — Other Ambulatory Visit: Payer: Self-pay

## 2024-04-03 ENCOUNTER — Encounter: Payer: Self-pay | Admitting: Obstetrics and Gynecology

## 2024-04-03 ENCOUNTER — Ambulatory Visit: Admitting: Obstetrics and Gynecology

## 2024-04-03 VITALS — BP 123/80 | HR 69 | Wt 155.8 lb

## 2024-04-03 DIAGNOSIS — Z712 Person consulting for explanation of examination or test findings: Secondary | ICD-10-CM

## 2024-04-03 NOTE — Progress Notes (Signed)
 53 yo postmenopausal P2 with BMI 28 here to discuss colposcopy results. Patient reports feeling well and is without any other complaints  Past Medical History:  Diagnosis Date   Anxiety    CTS (carpal tunnel syndrome)    right   Past Surgical History:  Procedure Laterality Date   CARPAL TUNNEL RELEASE Right 05/27/2020   Procedure: CARPAL TUNNEL RELEASE;  Surgeon: Murrell Drivers, MD;  Location: [facility];  Service: Orthopedics;  Laterality: Right;   INCISION AND DRAINAGE BREAST ABSCESS     NECK SURGERY     c-spine   Family History  Problem Relation Age of Onset   Diabetes Mother    Thyroid disease Mother    Colon cancer Neg Hx    Stomach cancer Neg Hx    Rectal cancer Neg Hx    Social History   Tobacco Use   Smoking status: Former    Current packs/day: 0.00    Types: Cigarettes    Quit date: 09/2023    Years since quitting: 0.5   Smokeless tobacco: Never  Vaping Use   Vaping status: Never Used  Substance Use Topics   Alcohol use: Yes    Comment: occasional   Drug use: No   ROS See pertinent in HPI. All other systems reviewed and non contributory Blood pressure 123/80, pulse 69, weight 155 lb 12.8 oz (70.7 kg), last menstrual period 07/04/2019. GENERAL: Well-developed, well-nourished female in no acute distress.  NEURO: alert and oriented x 3  A/P Patient here for colposcopy results - Reviewed pap smear result ASCUS +HRHPV 02/2024 - colpo results CIN-1 - Plan for repeat pap smear in 1 year explained to patient

## 2024-04-03 NOTE — Progress Notes (Signed)
 Pt presents for follow up from colposcopy. No other  Questions or concerns.

## 2024-04-11 ENCOUNTER — Other Ambulatory Visit: Payer: Self-pay

## 2024-04-11 MED ORDER — ATORVASTATIN CALCIUM 20 MG PO TABS
20.0000 mg | ORAL_TABLET | Freq: Every day | ORAL | 5 refills | Status: AC
  Filled 2024-04-11 – 2024-04-20 (×2): qty 30, 30d supply, fill #0
  Filled 2024-05-24 – 2024-06-30 (×2): qty 30, 30d supply, fill #1
  Filled 2024-08-02: qty 30, 30d supply, fill #2
  Filled 2024-09-04: qty 30, 30d supply, fill #3
  Filled 2024-10-05: qty 30, 30d supply, fill #4

## 2024-04-11 MED ORDER — HYDROXYZINE HCL 25 MG PO TABS
25.0000 mg | ORAL_TABLET | Freq: Every evening | ORAL | 5 refills | Status: AC | PRN
  Filled 2024-04-11: qty 30, 30d supply, fill #0

## 2024-04-17 ENCOUNTER — Inpatient Hospital Stay
Admission: RE | Admit: 2024-04-17 | Discharge: 2024-04-17 | Discharge status: Home / Self Care | Source: Ambulatory Visit | Attending: Physician Assistant

## 2024-04-17 DIAGNOSIS — Z1231 Encounter for screening mammogram for malignant neoplasm of breast: Secondary | ICD-10-CM

## 2024-04-21 ENCOUNTER — Other Ambulatory Visit: Payer: Self-pay

## 2024-05-25 ENCOUNTER — Other Ambulatory Visit: Payer: Self-pay

## 2024-05-29 ENCOUNTER — Other Ambulatory Visit: Payer: Self-pay

## 2024-06-01 ENCOUNTER — Other Ambulatory Visit: Payer: Self-pay

## 2024-06-02 ENCOUNTER — Other Ambulatory Visit: Payer: Self-pay

## 2024-06-30 ENCOUNTER — Other Ambulatory Visit: Payer: Self-pay

## 2024-07-25 ENCOUNTER — Other Ambulatory Visit: Payer: Self-pay

## 2024-07-25 MED ORDER — METHYLPREDNISOLONE 4 MG PO TBPK
ORAL_TABLET | ORAL | 0 refills | Status: AC
  Filled 2024-07-25: qty 21, 6d supply, fill #0

## 2024-07-25 MED ORDER — HYDROXYZINE HCL 25 MG PO TABS
25.0000 mg | ORAL_TABLET | Freq: Every evening | ORAL | 5 refills | Status: AC | PRN
  Filled 2024-07-25: qty 30, 30d supply, fill #0

## 2024-07-25 MED ORDER — ESCITALOPRAM OXALATE 20 MG PO TABS
20.0000 mg | ORAL_TABLET | Freq: Every day | ORAL | 2 refills | Status: AC
  Filled 2024-07-25: qty 30, 30d supply, fill #0
  Filled 2024-09-04: qty 30, 30d supply, fill #1

## 2024-08-04 ENCOUNTER — Other Ambulatory Visit: Payer: Self-pay

## 2024-08-10 ENCOUNTER — Other Ambulatory Visit: Payer: Self-pay

## 2024-08-10 MED ORDER — DICLOFENAC SODIUM 75 MG PO TBEC
75.0000 mg | DELAYED_RELEASE_TABLET | Freq: Two times a day (BID) | ORAL | 2 refills | Status: AC | PRN
  Filled 2024-08-10: qty 60, 30d supply, fill #0

## 2024-08-10 MED ORDER — TIZANIDINE HCL 4 MG PO TABS
4.0000 mg | ORAL_TABLET | Freq: Two times a day (BID) | ORAL | 2 refills | Status: AC | PRN
  Filled 2024-08-10: qty 30, 15d supply, fill #0

## 2024-08-11 ENCOUNTER — Other Ambulatory Visit: Payer: Self-pay

## 2024-09-04 ENCOUNTER — Other Ambulatory Visit: Payer: Self-pay

## 2024-09-06 ENCOUNTER — Other Ambulatory Visit: Payer: Self-pay

## 2024-09-06 ENCOUNTER — Other Ambulatory Visit (HOSPITAL_COMMUNITY): Payer: Self-pay

## 2024-09-06 MED ORDER — PREDNISONE 10 MG (21) PO TBPK
ORAL_TABLET | ORAL | 0 refills | Status: AC
  Filled 2024-09-06 – 2024-09-20 (×2): qty 21, 6d supply, fill #0

## 2024-09-14 ENCOUNTER — Other Ambulatory Visit (HOSPITAL_COMMUNITY): Payer: Self-pay

## 2024-09-14 ENCOUNTER — Other Ambulatory Visit: Payer: Self-pay

## 2024-09-18 ENCOUNTER — Other Ambulatory Visit (HOSPITAL_COMMUNITY): Payer: Self-pay

## 2024-09-20 ENCOUNTER — Other Ambulatory Visit (HOSPITAL_COMMUNITY): Payer: Self-pay

## 2024-10-03 ENCOUNTER — Other Ambulatory Visit: Payer: Self-pay

## 2024-10-03 MED ORDER — TRAZODONE HCL 100 MG PO TABS
100.0000 mg | ORAL_TABLET | Freq: Every evening | ORAL | 2 refills | Status: AC | PRN
  Filled 2024-10-03: qty 30, 30d supply, fill #0

## 2024-10-03 MED ORDER — ESCITALOPRAM OXALATE 20 MG PO TABS
20.0000 mg | ORAL_TABLET | Freq: Every day | ORAL | 5 refills | Status: AC
  Filled 2024-10-03: qty 30, 30d supply, fill #0

## 2024-10-03 MED ORDER — HYDROXYZINE HCL 25 MG PO TABS
25.0000 mg | ORAL_TABLET | Freq: Two times a day (BID) | ORAL | 5 refills | Status: AC | PRN
  Filled 2024-10-03: qty 30, 15d supply, fill #0
